# Patient Record
Sex: Male | Born: 1970 | Race: Black or African American | Hispanic: No | Smoking: Never smoker
Health system: Southern US, Community
[De-identification: ages and names within clinical notes are randomized; demographics above are authoritative.]

## PROBLEM LIST (undated history)

## (undated) DIAGNOSIS — I10 Essential (primary) hypertension: Secondary | ICD-10-CM

---

## 2008-06-13 ENCOUNTER — Emergency Department (HOSPITAL_COMMUNITY): Admission: EM | Admit: 2008-06-13 | Discharge: 2008-06-13 | Payer: Self-pay | Admitting: Emergency Medicine

## 2009-10-07 ENCOUNTER — Emergency Department (HOSPITAL_COMMUNITY): Admission: EM | Admit: 2009-10-07 | Discharge: 2009-10-08 | Payer: Self-pay | Admitting: Emergency Medicine

## 2009-12-09 ENCOUNTER — Emergency Department (HOSPITAL_COMMUNITY): Admission: EM | Admit: 2009-12-09 | Discharge: 2009-12-10 | Payer: Self-pay | Admitting: Emergency Medicine

## 2009-12-16 ENCOUNTER — Emergency Department (HOSPITAL_COMMUNITY): Admission: EM | Admit: 2009-12-16 | Discharge: 2009-12-17 | Payer: Self-pay | Admitting: Emergency Medicine

## 2009-12-17 ENCOUNTER — Ambulatory Visit: Payer: Self-pay | Admitting: Psychiatry

## 2009-12-17 ENCOUNTER — Inpatient Hospital Stay (HOSPITAL_COMMUNITY): Admission: AD | Admit: 2009-12-17 | Discharge: 2009-12-22 | Payer: Self-pay | Admitting: Psychiatry

## 2010-02-18 ENCOUNTER — Ambulatory Visit: Payer: Self-pay | Admitting: Psychiatry

## 2010-02-18 ENCOUNTER — Emergency Department (HOSPITAL_COMMUNITY)
Admission: EM | Admit: 2010-02-18 | Discharge: 2010-02-18 | Payer: Self-pay | Source: Home / Self Care | Admitting: Emergency Medicine

## 2010-10-04 ENCOUNTER — Emergency Department (HOSPITAL_COMMUNITY)
Admission: EM | Admit: 2010-10-04 | Discharge: 2010-10-04 | Payer: Self-pay | Source: Home / Self Care | Admitting: Emergency Medicine

## 2010-10-22 ENCOUNTER — Observation Stay (HOSPITAL_COMMUNITY)
Admission: RE | Admit: 2010-10-22 | Discharge: 2010-10-24 | Disposition: A | Discharge status: Home / Self Care | Payer: BC Managed Care – PPO | Source: Ambulatory Visit | Attending: Internal Medicine | Admitting: Internal Medicine

## 2010-10-22 ENCOUNTER — Emergency Department (HOSPITAL_COMMUNITY)
Admit: 2010-10-22 | Discharge: 2010-10-22 | Disposition: A | Discharge status: Home / Self Care | Payer: BC Managed Care – PPO

## 2010-10-22 DIAGNOSIS — N179 Acute kidney failure, unspecified: Secondary | ICD-10-CM | POA: Insufficient documentation

## 2010-10-22 DIAGNOSIS — E871 Hypo-osmolality and hyponatremia: Secondary | ICD-10-CM | POA: Insufficient documentation

## 2010-10-22 DIAGNOSIS — F10239 Alcohol dependence with withdrawal, unspecified: Secondary | ICD-10-CM | POA: Insufficient documentation

## 2010-10-22 DIAGNOSIS — R55 Syncope and collapse: Secondary | ICD-10-CM | POA: Insufficient documentation

## 2010-10-22 DIAGNOSIS — D649 Anemia, unspecified: Secondary | ICD-10-CM | POA: Insufficient documentation

## 2010-10-22 DIAGNOSIS — E876 Hypokalemia: Secondary | ICD-10-CM | POA: Insufficient documentation

## 2010-10-22 DIAGNOSIS — F10939 Alcohol use, unspecified with withdrawal, unspecified: Principal | ICD-10-CM | POA: Insufficient documentation

## 2010-10-22 DIAGNOSIS — I10 Essential (primary) hypertension: Secondary | ICD-10-CM | POA: Insufficient documentation

## 2010-10-22 DIAGNOSIS — D696 Thrombocytopenia, unspecified: Secondary | ICD-10-CM | POA: Insufficient documentation

## 2010-10-22 DIAGNOSIS — D72819 Decreased white blood cell count, unspecified: Secondary | ICD-10-CM | POA: Insufficient documentation

## 2010-10-22 DIAGNOSIS — R000 Tachycardia, unspecified: Secondary | ICD-10-CM | POA: Insufficient documentation

## 2010-10-22 DIAGNOSIS — F1010 Alcohol abuse, uncomplicated: Secondary | ICD-10-CM | POA: Insufficient documentation

## 2010-10-22 LAB — DIFFERENTIAL
Basophils Relative: 0 % (ref 0–1)
Eosinophils Absolute: 0 10*3/uL (ref 0.0–0.7)
Eosinophils Relative: 0 % (ref 0–5)
Lymphocytes Relative: 9 % — ABNORMAL LOW (ref 12–46)
Monocytes Absolute: 0.7 10*3/uL (ref 0.1–1.0)
Monocytes Relative: 11 % (ref 3–12)
Neutrophils Relative %: 80 % — ABNORMAL HIGH (ref 43–77)

## 2010-10-22 LAB — CK TOTAL AND CKMB (NOT AT ARMC)
CK, MB: 4.1 ng/mL — ABNORMAL HIGH (ref 0.3–4.0)
Relative Index: 0.6 (ref 0.0–2.5)
Total CK: 651 U/L — ABNORMAL HIGH (ref 7–232)

## 2010-10-22 LAB — COMPREHENSIVE METABOLIC PANEL
ALT: 75 U/L — ABNORMAL HIGH (ref 0–53)
AST: 136 U/L — ABNORMAL HIGH (ref 0–37)
Albumin: 5 g/dL (ref 3.5–5.2)
Alkaline Phosphatase: 90 U/L (ref 39–117)
BUN: 12 mg/dL (ref 6–23)
CO2: 30 mEq/L (ref 19–32)
Calcium: 9.7 mg/dL (ref 8.4–10.5)
Chloride: 82 mEq/L — ABNORMAL LOW (ref 96–112)
Creatinine, Ser: 1.52 mg/dL — ABNORMAL HIGH (ref 0.4–1.5)
GFR calc Af Amer: >60 mL/min (ref >60)
GFR calc non Af Amer: 51 mL/min — ABNORMAL LOW (ref >60)
Glucose, Bld: 133 mg/dL — ABNORMAL HIGH (ref 70–99)
Potassium: 2.7 mEq/L — CL (ref 3.5–5.1)
Sodium: 130 mEq/L — ABNORMAL LOW (ref 135–145)
Total Bilirubin: 3.2 mg/dL — ABNORMAL HIGH (ref 0.3–1.2)
Total Protein: 9.3 g/dL — ABNORMAL HIGH (ref 6.0–8.3)

## 2010-10-22 LAB — GLUCOSE, CAPILLARY: Glucose-Capillary: 242 mg/dL — ABNORMAL HIGH (ref 70–99)

## 2010-10-22 LAB — RAPID URINE DRUG SCREEN, HOSP PERFORMED
Amphetamines: NOT DETECTED
Barbiturates: NOT DETECTED
Benzodiazepines: NOT DETECTED
Cocaine: NOT DETECTED
Opiates: NOT DETECTED
Tetrahydrocannabinol: NOT DETECTED

## 2010-10-22 LAB — CBC
MCV: 83.5 fL (ref 78.0–100.0)
Platelets: 120 10*3/uL — ABNORMAL LOW (ref 150–400)

## 2010-10-22 LAB — ETHANOL: Alcohol, Ethyl (B): <5 mg/dL (ref 0–10)

## 2010-10-23 ENCOUNTER — Observation Stay (HOSPITAL_COMMUNITY)
Admit: 2010-10-23 | Discharge: 2010-10-23 | Disposition: A | Discharge status: Home / Self Care | Payer: BC Managed Care – PPO | Attending: Internal Medicine | Admitting: Internal Medicine

## 2010-10-23 DIAGNOSIS — I059 Rheumatic mitral valve disease, unspecified: Secondary | ICD-10-CM

## 2010-10-23 LAB — DIFFERENTIAL
Eosinophils Absolute: 0 10*3/uL (ref 0.0–0.7)
Eosinophils Relative: 0 % (ref 0–5)
Lymphocytes Relative: 24 % (ref 12–46)
Neutro Abs: 2.8 10*3/uL (ref 1.7–7.7)

## 2010-10-23 LAB — IRON AND TIBC
Iron: 58 ug/dL (ref 42–135)
Saturation Ratios: 16 % — ABNORMAL LOW (ref 20–55)
TIBC: 370 ug/dL (ref 215–435)
UIBC: 312 ug/dL

## 2010-10-23 LAB — BASIC METABOLIC PANEL
BUN: 12 mg/dL (ref 6–23)
CO2: 27 mEq/L (ref 19–32)
Chloride: 93 mEq/L — ABNORMAL LOW (ref 96–112)
Creatinine, Ser: 1.01 mg/dL (ref 0.4–1.5)
GFR calc Af Amer: >60 mL/min (ref >60)
Potassium: 2.3 mEq/L — CL (ref 3.5–5.1)
Sodium: 132 mEq/L — ABNORMAL LOW (ref 135–145)

## 2010-10-23 LAB — CARDIAC PANEL(CRET KIN+CKTOT+MB+TROPI): Relative Index: 0.8 (ref 0.0–2.5)

## 2010-10-23 LAB — TSH: TSH: 1.454 u[IU]/mL (ref 0.350–4.500)

## 2010-10-23 LAB — CBC
Hemoglobin: 12.9 g/dL — ABNORMAL LOW (ref 13.0–17.0)
RBC: 4.31 MIL/uL (ref 4.22–5.81)
WBC: 4.6 10*3/uL (ref 4.0–10.5)

## 2010-10-23 LAB — MAGNESIUM
Magnesium: 1.1 mg/dL — ABNORMAL LOW (ref 1.5–2.5)
Magnesium: 2.1 mg/dL (ref 1.5–2.5)

## 2010-10-23 LAB — FERRITIN: Ferritin: 873 ng/mL — ABNORMAL HIGH (ref 22–322)

## 2010-10-24 LAB — DIFFERENTIAL
Basophils Absolute: 0 10*3/uL (ref 0.0–0.1)
Lymphocytes Relative: 21 % (ref 12–46)
Lymphs Abs: 0.7 10*3/uL (ref 0.7–4.0)
Monocytes Absolute: 0.4 10*3/uL (ref 0.1–1.0)
Monocytes Relative: 12 % (ref 3–12)
Neutro Abs: 2.1 10*3/uL (ref 1.7–7.7)

## 2010-10-24 LAB — COMPREHENSIVE METABOLIC PANEL
ALT: 50 U/L (ref 0–53)
Alkaline Phosphatase: 65 U/L (ref 39–117)
BUN: 8 mg/dL (ref 6–23)
CO2: 28 mEq/L (ref 19–32)
Calcium: 8.2 mg/dL — ABNORMAL LOW (ref 8.4–10.5)
GFR calc non Af Amer: >60 mL/min (ref >60)
Glucose, Bld: 107 mg/dL — ABNORMAL HIGH (ref 70–99)
Sodium: 134 mEq/L — ABNORMAL LOW (ref 135–145)

## 2010-10-24 LAB — MAGNESIUM: Magnesium: 1.7 mg/dL (ref 1.5–2.5)

## 2010-10-24 LAB — CBC
HCT: 34.6 % — ABNORMAL LOW (ref 39.0–52.0)
Hemoglobin: 12.3 g/dL — ABNORMAL LOW (ref 13.0–17.0)
MCHC: 35.5 g/dL (ref 30.0–36.0)
MCV: 86.5 fL (ref 78.0–100.0)

## 2010-10-24 NOTE — H&P (Signed)
Adam Vaughan, MCFARREN                ACCOUNT NO.:  [id number]  MEDICAL RECORD NO.:  [id number]           PATIENT TYPE:  E  LOCATION:  APED                          FACILITY:  [facility]  PHYSICIAN:  Hollice Espy, M.D.DATE OF BIRTH:  Oct 08, 1970  DATE OF ADMISSION:  10/22/2010 DATE OF DISCHARGE:  LH                             HISTORY & PHYSICAL   ATTENDING PHYSICIAN:  Hollice Espy, MD  PATIENT'S PCP:  C. Duane Lope, MD, of [facility] in [place].  CHIEF COMPLAINT:  Syncope.  HISTORY OF PRESENT ILLNESS:  The patient is a 40 year old African male, past medical history of hypertension and alcohol abuse, who presented to the emergency room after he had a syncopal event and possible seizure. He does not recall much of the events.  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  The patient does not remember much whether or not he had warnings, but just reportedly his legs buckled underneath him and then the ground started to shake, this lasted for about a minute or so and when he woke up, he was still noted to be somewhat confused and brought into the emergency room at [facility].  Emergency room is noted to have a potassium of 2.7, creatinine of 1.5, which when compared to a creatinine in June of 2011, it was 0.88.  Urine drug screen was noted to be unremarkable as his serum alcohol level.  His CPK was elevated with a normal MB and troponin and although it was noted that his total bilirubin was 3.2.  It was suspected that the patient likely had a withdrawal seizure.  The patient reports drinking very heavily in the past about a fifth of liter and a pint and now he says he drinks about three beers a night, although he seems to be downplaying this.  When I saw the patient, he was quite tremulous and anxious.  He was still somewhat confused saying  that he thought he was at a different place rather than [facility] ER, but otherwise appears to be interactive.  He denies any headaches, vision changes, dysphagia, chest pain, palpitations, shortness of breath, wheeze, cough, abdominal pain, hematuria, dysuria, constipation, diarrhea, focal extremity numbness, weakness, or pain.  REVIEW OF SYSTEMS:  Otherwise negative.  PAST MEDICAL HISTORY:  Includes hypertension, heavy alcohol use.  He recently injured his ankle.  MEDICATIONS:  The patient is on two blood pressure medications, he cannot recall them at this time.  He was in the emergency room several weeks ago for his injured ankle and received Ativan and narcotic medication at that time since then he has run out of both.  He has no known drug allergies.  SOCIAL HISTORY:  He says he drinks a few beers a day, which I think he may be down planning this.  He denies any tobacco or drug use.  FAMILY HISTORY:  Noted for hypertension.  PHYSICAL EXAMINATION:  VITAL SIGNS:  On admission, temp 98.6, heart  rate 116 ranging up to as high as 126, blood pressure 141/110, respirations 23, O2 sat 97% on room air. GENERAL:  He is alert and oriented x2.  He appears agitated. HEENT:  Normocephalic, atraumatic.  His eyes are blood.  Mucous membranes are slightly dry. HEART:  Regular rhythm, mild tachycardia. LUNGS:  Clear to auscultation bilaterally. ABDOMEN:  Soft, nontender, nondistended.  Positive bowel sounds. EXTREMITIES:  Show no clubbing, cyanosis, or trace pitting edema.  LABORATORY WORK:  White count 6.1, H and H 16 and 42, MCV 84, platelet count 120, 80% neutrophils.  Sodium 130, potassium 2.7, chloride 82, bicarb 30, BUN 12, creatinine 1.5, glucose 133.  Please note the patient has an anion gap of 18, alcohol level less than 5.  Urine drug screen is clear.  CPK is 651, MB 4.1, troponin I 0.01.  His LFTs are noted for a total bilirubin 3.2, normal albumin, AST 136, ALT of 75.  EKG  notes some borderline LVH, but otherwise is sinus tachycardia.  CT scan of the head is unremarkable.  ASSESSMENT AND PLAN: 1. Syncope. 2. Questionable secondary seizure. 3. Severe agitation. 4. Suspected alcohol withdrawal. 5. Hypokalemia, which I think is a by-product of the above. 6. Hypertension. 7. Metabolic acidosis, which I suspect is from ethanol.  We will plan to aggressively hydrate the patient, replace potassium. Recheck labs, noted elevated bilirubin indicates chronic probably some underlying chronic liver issues from his drinking, I am seeing nothing. We will plan to admit the patient.  We will go ahead and start CIWA Ativan protocol, clonidine 0.2 p.o. b.i.d. and p.r.n. IV Lopressor.  I will place him on telemetry unit.     Hollice Espy, M.D.     SKK/MEDQ  D:  10/22/2010  T:  10/22/2010  Job:  [id number]  cc:   C. Duane Lope, M.D. Fax: [phone]  Electronically Signed by Virginia Rochester M.D. on 10/24/2010 07:31:22 PM

## 2010-10-31 NOTE — Discharge Summary (Signed)
Adam Vaughan, Adam Vaughan                ACCOUNT NO.:  [id number]  MEDICAL RECORD NO.:  [id number]           PATIENT TYPE:  I  LOCATION:  A313                          FACILITY:  [facility]  PHYSICIAN:  Elliot Cousin, M.D.    DATE OF BIRTH:  1971/04/05  DATE OF ADMISSION:  10/22/2010 DATE OF DISCHARGE:  02/04/2012LH                              DISCHARGE SUMMARY   DISCHARGE DIAGNOSES: 1. Syncope, probably secondary to alcohol withdrawal seizure and     electrolyte abnormalities. 2. Probable alcohol withdrawal seizure. 3. Alcohol abuse. 4. Hypokalemia and hypomagnesemia, likely secondary to alcohol abuse. 5. Acute renal failure secondary to prerenal azotemia.  Completely     resolved. 6. Leukopenia and thrombocytopenia, likely secondary to alcohol abuse. 7. Normocytic anemia.  The patient's hemoglobin was 12.3 prior to     discharge.  His anemia panel revealed total iron of 58, TIBC of     370, vitamin B12 of 613, folate of 10.5, and ferritin of 873. 8. Moderately elevated CK, status post fall from syncopal episode. 9. Cardiomyopathy.  The patient's ejection fraction was noted to be 40-     45% with grade 1 diastolic dysfunction. 10.Mild left elbow contusion secondary to the fall. 11.Elevated ammonia level with no evidence of hepatic     encephalopathy or asterixis. 12.Hypertension. 13.Sinus tachycardia. 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 15.Hyponatremia.  DISCHARGE MEDICATIONS: 1. Lorazepam 1 mg every 8 hours as needed for shaking with     withdrawal symptoms.  The patient was advised to not take this     medication with alcohol use. 2. Magnesium oxide 400 mg daily for 2 weeks. 3. Multivitamin with iron 1 capsule daily. 4. Potassium chloride 20 mEq daily for 2 weeks. 5. Thiamine 100 mg daily. 6. Quinapril/hydrochlorothiazide 20/12.5 mg.  The dose was decreased     to half a tablet daily. 7. Trazadone 50 mg 1-2 tablets at bedtime p.r.n.  DISCHARGE DISPOSITION:  The patient was discharged home  in improved and stable condition on October 24, 2010.  He was advised to follow up with his primary care physician Dr. Tenny Craw in 1-2 weeks.  CONSULTATIONS:  Clinical Child psychotherapist.  PROCEDURES PERFORMED: 1. X-ray of the left elbow on October 23, 2010.  The results revealed     no acute osseous abnormalities.  No elbow joint effusion. 2. Bilateral carotid duplex ultrasound on October 23, 2010.  The     results revealed minimal plaque formation in the bilateral carotid     systems.  No evidence of hemodynamically significant stenosis. 3. 2-D echocardiogram on October 23, 2010.  The results revealed left     ventricular cavity size was normal.  Wall thickness was normal.     Prominent trabeculation and papillary muscle at the apex.  Ejection     fraction was in the range of 40-45%.  Hypokinesis of the distal     anterior septal and apical myocardium.  Doppler parameters are     consistent with abnormal left ventricular relaxation (grade 1     diastolic dysfunction).  Mild mitral valve regurgitation.  Trivial     tricuspid valve regurgitation.  HISTORY OF PRESENT ILLNESS:  The patient is a 40 year old man with a past medical history significant for alcoholism and hypertension, who presented to the emergency department on October 22, 2010, after apparently passing out.  The patient was noted to have some body shaking on the ground.  There was no  head trauma.  He had recently decreased his alcohol intake significantly.  He had been drinking one  5th of liquor daily but had recently changed to beer only.  He had been drinking four 16 ounces beers daily.  That day, the patient only had 2 beers hours before he passed out.  In the emergency department, he was noted to be afebrile.  He was moderately hypertensive with a blood pressure of 141/110.  He was tachycardic with a heart rate of 116.  He was oxygenating 97%.  His lab data were significant for a normal CT scan of the head, alcohol level  of less than 5, sodium of 130, potassium of 2.7, creatinine of 1.52, CK of 651, and a negative urine drug screen.  He was admitted for further evaluation and management.  HOSPITAL COURSE: 1. SYNCOPE.  The patient was started on IV fluid hydration with D5     normal saline with potassium chloride added.  He was repleted with     potassium chloride orally as well.  The Ativan alcohol withdrawal     protocol was initiated with Ativan and vitamin therapy.  For     further evaluation, a number of studies were ordered including     cardiac enzymes, ammonia level, TSH, free T4, 2-D echocardiogram,     and carotid ultrasound.  His cardiac enzymes were such that he     ruled out for myocardial infarction.  However, his CK was     moderately elevated ranging from 651 to 571.  The relative indices     were within normal limits.  His troponin I's were within normal     limits as well.  His TSH was within normal limits at 1.45.  His     free T4 was within normal limits at 1.36.  His carotid ultrasound     revealed no significant ICA stenosis.  The 2-D echocardiogram     revealed an ejection fraction of 40-45% with hypokinesis of the     distal anterior septal and apical myocardium.  It also revealed     grade 1 diastolic dysfunction.  The patient had no evidence of     decompensated congestive heart failure.  This finding was obviously     new.  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     He may need further evaluation by a cardiologist in the outpatient     setting.  This decision will be deferred to his primary care     physician Dr. Tenny Craw.  The patient had no further syncopal episodes.     The exact etiology of his syncopal episode was unknown.  However,     it was likely that the patient had an alcohol withdrawal seizure.     An EEG was not ordered due to the lack of yield and a high     suspicion of alcohol withdrawal seizure anyway. 2. ALCOHOL ABUSE AND ALCOHOL WITHDRAWAL  SYNDROME.  The patient was     started on Ativan scheduled p.r.n.  Vitamin therapy with thiamine,     multivitamin, and folate was initiated.  For the first 24-36 hours,  the patient was tremulous and tachycardic.  His ammonia level was     elevated.  However, there was no evidence of hepatic     encephalopathy.  On exam, there was a  mild tremor initially but     no asterixis at all .  The patient had     been through inpatient and outpatient rehabilitation in the past.     He had been sober for 7 months, approximately 2 years ago.  He had     been trying to wean himself from hard liquor to beer.  Up until     hours before he was admitted to the hospital, he had been drinking     four 16 ounces beers daily.  He does have a desire to quit     altogether.  The clinical social worker was consulted.  She     provided him with additional resources including AA that would help     with his sobriety.  At the time of hospital discharge, his     tachycardia had subsided.  His tremor had resolved.  He was     ambulating in the hallway with no difficulty.  He remained alert     and oriented and very cooperative during the entire     hospitalization.  He was encouraged to continue to seek out AA     and/or other services to keep him sober.  He was also advised to     continue vitamin therapy. 3. ELECTROLYTE ABNORMALITIES AND ACUTE RENAL FAILURE.  On admission,     the patient's serum sodium was 130, serum potassium was 2.7, and     creatinine was 1.52.  He was repleted with potassium chloride     orally and in the IV fluids.  A blood magnesium level was assessed.     It was low at 1.1.  He was treated with 2 g of magnesium sulfate.     His followup blood magnesium level improved to 2.1 and then 1.7     prior to discharge.  His serum sodium improved to 134 and his serum     potassium improved to 3.4 prior to discharge.  His creatinine     improved to 0.90.  He was discharged on potassium chloride  and     magnesium oxide for 2 more weeks. 4. THROMBOCYTOPENIA, ANEMIA, AND LEUKOPENIA.  The patient's WBC was     6.1, hemoglobin 15.5, and platelet count was 120 on admission.     Following hydration, his WBC fell to 3.2, hemoglobin fell to 12.3,     and platelet count fell to 90.  In part, the decreased levels were     due to hemodilution from the IV fluids.  A major contribution was     bone marrow suppression from chronic alcohol abuse.  An anemia     panel was ordered.  The results were dictated above.  Hopefully,     his blood counts will improve off of alcohol.  His CBC may need to     be monitored periodically. 5. HYPERTENSION AND SINUS TACHYCARDIA.  The patient's systolic blood     pressure was modestly elevated although his diastolic blood     pressure was moderately elevated on admission.  He was tachycardic,     owing to volume depletion and alcohol withdrawal syndrome.     Quinapril/HCTZ was discontinued in favor of clonidine.  The     clonidine was eventually discontinued  prior to hospital discharge     as the patient's blood pressure was on the low side of normal and     his tachycardia subsided and then resolved.  Due to his low normal     blood pressures, presumably because he had not been drinking     alcohol, he was advised to restart quinapril/HCTZ at half the dose.     He was advised to follow up with his primary care physician for     reassessment in 1-2 weeks.     Elliot Cousin, M.D.     DF/MEDQ  D:  10/24/2010  T:  10/25/2010  Job:  [id number]  cc:   C. Duane Lope, M.D. Fax: [phone]  Electronically Signed by Elliot Cousin M.D. on 10/25/2010 05:12:53 PM

## 2010-12-07 LAB — BASIC METABOLIC PANEL
CO2: 28 mEq/L (ref 19–32)
CO2: 26 mEq/L (ref 19–32)
Calcium: 8.8 mg/dL (ref 8.4–10.5)
Calcium: 8.4 mg/dL (ref 8.4–10.5)
GFR calc Af Amer: >60 mL/min (ref >60)
GFR calc Af Amer: >60 mL/min (ref >60)
GFR calc non Af Amer: >60 mL/min (ref >60)
Sodium: 129 mEq/L — ABNORMAL LOW (ref 135–145)
Sodium: 145 mEq/L (ref 135–145)

## 2010-12-07 LAB — ETHANOL
Alcohol, Ethyl (B): 346 mg/dL — ABNORMAL HIGH (ref 0–10)
Alcohol, Ethyl (B): 403 mg/dL (ref 0–10)

## 2010-12-07 LAB — CBC
HCT: 38.8 % — ABNORMAL LOW (ref 39.0–52.0)
Hemoglobin: 13.3 g/dL (ref 13.0–17.0)
Hemoglobin: 13.8 g/dL (ref 13.0–17.0)
MCHC: 34.3 g/dL (ref 30.0–36.0)
MCHC: 33 g/dL (ref 30.0–36.0)
RBC: 4.79 MIL/uL (ref 4.22–5.81)
RDW: 13.6 % (ref 11.5–15.5)
WBC: 6.7 10*3/uL (ref 4.0–10.5)

## 2010-12-07 LAB — COMPREHENSIVE METABOLIC PANEL
BUN: 16 mg/dL (ref 6–23)
Calcium: 9.5 mg/dL (ref 8.4–10.5)
Glucose, Bld: 115 mg/dL — ABNORMAL HIGH (ref 70–99)
Total Protein: 8.4 g/dL — ABNORMAL HIGH (ref 6.0–8.3)

## 2010-12-07 LAB — RAPID URINE DRUG SCREEN, HOSP PERFORMED
Amphetamines: NOT DETECTED
Benzodiazepines: NOT DETECTED
Cocaine: NOT DETECTED
Opiates: NOT DETECTED
Tetrahydrocannabinol: NOT DETECTED

## 2010-12-07 LAB — URINALYSIS, ROUTINE W REFLEX MICROSCOPIC
Bilirubin Urine: NEGATIVE
Protein, ur: 30 mg/dL — AB
Urobilinogen, UA: 1 mg/dL (ref 0.0–1.0)

## 2010-12-07 LAB — DIFFERENTIAL
Basophils Relative: 1 % (ref 0–1)
Lymphocytes Relative: 39 % (ref 12–46)
Lymphs Abs: 1 10*3/uL (ref 0.7–4.0)
Lymphs Abs: 2.6 10*3/uL (ref 0.7–4.0)
Monocytes Absolute: 0.5 10*3/uL (ref 0.1–1.0)
Monocytes Relative: 16 % — ABNORMAL HIGH (ref 3–12)
Monocytes Relative: 7 % (ref 3–12)
Neutro Abs: 2.6 10*3/uL (ref 1.7–7.7)
Neutro Abs: 3.5 10*3/uL (ref 1.7–7.7)
Neutrophils Relative %: 60 % (ref 43–77)

## 2010-12-09 LAB — HEPATIC FUNCTION PANEL
Bilirubin, Direct: 0.1 mg/dL (ref 0.0–0.3)
Indirect Bilirubin: 0.4 mg/dL (ref 0.3–0.9)
Total Bilirubin: 0.5 mg/dL (ref 0.3–1.2)

## 2010-12-13 LAB — DIFFERENTIAL
Basophils Absolute: 0 10*3/uL (ref 0.0–0.1)
Basophils Relative: 1 % (ref 0–1)
Eosinophils Absolute: 0 10*3/uL (ref 0.0–0.7)
Eosinophils Absolute: 0 10*3/uL (ref 0.0–0.7)
Eosinophils Relative: 0 % (ref 0–5)
Eosinophils Relative: 0 % (ref 0–5)
Lymphocytes Relative: 15 % (ref 12–46)
Lymphocytes Relative: 24 % (ref 12–46)
Lymphs Abs: 0.6 10*3/uL — ABNORMAL LOW (ref 0.7–4.0)
Lymphs Abs: 1 10*3/uL (ref 0.7–4.0)
Monocytes Absolute: 0.4 10*3/uL (ref 0.1–1.0)
Monocytes Absolute: 0.6 10*3/uL (ref 0.1–1.0)
Monocytes Relative: 10 % (ref 3–12)
Monocytes Relative: 17 % — ABNORMAL HIGH (ref 3–12)
Neutro Abs: 2.8 10*3/uL (ref 1.7–7.7)
Neutrophils Relative %: 66 % (ref 43–77)

## 2010-12-13 LAB — CBC
HCT: 30.5 % — ABNORMAL LOW (ref 39.0–52.0)
HCT: 36.7 % — ABNORMAL LOW (ref 39.0–52.0)
Hemoglobin: 10.7 g/dL — ABNORMAL LOW (ref 13.0–17.0)
Hemoglobin: 12.7 g/dL — ABNORMAL LOW (ref 13.0–17.0)
MCHC: 34.6 g/dL (ref 30.0–36.0)
MCV: 90.3 fL (ref 78.0–100.0)
MCV: 91.6 fL (ref 78.0–100.0)
Platelets: 127 10*3/uL — ABNORMAL LOW (ref 150–400)
Platelets: 166 10*3/uL (ref 150–400)
RBC: 3.32 MIL/uL — ABNORMAL LOW (ref 4.22–5.81)
RBC: 4.06 MIL/uL — ABNORMAL LOW (ref 4.22–5.81)
RDW: 16.2 % — ABNORMAL HIGH (ref 11.5–15.5)
WBC: 3.9 10*3/uL — ABNORMAL LOW (ref 4.0–10.5)
WBC: 4.3 10*3/uL (ref 4.0–10.5)

## 2010-12-13 LAB — URINE MICROSCOPIC-ADD ON

## 2010-12-13 LAB — TRICYCLICS SCREEN, URINE: TCA Scrn: NOT DETECTED

## 2010-12-13 LAB — POCT I-STAT, CHEM 8
BUN: 11 mg/dL (ref 6–23)
BUN: 4 mg/dL — ABNORMAL LOW (ref 6–23)
Calcium, Ion: 0.95 mmol/L — ABNORMAL LOW (ref 1.12–1.32)
Calcium, Ion: 1.19 mmol/L (ref 1.12–1.32)
Chloride: 102 mEq/L (ref 96–112)
HCT: 41 % (ref 39.0–52.0)
Hemoglobin: 10.5 g/dL — ABNORMAL LOW (ref 13.0–17.0)
Potassium: 3 mEq/L — ABNORMAL LOW (ref 3.5–5.1)
Sodium: 137 mEq/L (ref 135–145)
TCO2: 26 mmol/L (ref 0–100)

## 2010-12-13 LAB — BASIC METABOLIC PANEL
BUN: 6 mg/dL (ref 6–23)
CO2: 25 mEq/L (ref 19–32)
Calcium: 9 mg/dL (ref 8.4–10.5)
Chloride: 96 mEq/L (ref 96–112)
Creatinine, Ser: 0.77 mg/dL (ref 0.4–1.5)
GFR calc Af Amer: >60 mL/min (ref >60)
GFR calc non Af Amer: >60 mL/min (ref >60)
Glucose, Bld: 103 mg/dL — ABNORMAL HIGH (ref 70–99)
Potassium: 2.8 mEq/L — ABNORMAL LOW (ref 3.5–5.1)
Sodium: 133 mEq/L — ABNORMAL LOW (ref 135–145)

## 2010-12-13 LAB — RAPID URINE DRUG SCREEN, HOSP PERFORMED
Amphetamines: NOT DETECTED
Amphetamines: NOT DETECTED
Barbiturates: NOT DETECTED
Barbiturates: NOT DETECTED
Benzodiazepines: NOT DETECTED
Benzodiazepines: NOT DETECTED
Cocaine: NOT DETECTED
Cocaine: NOT DETECTED
Opiates: NOT DETECTED
Opiates: NOT DETECTED
Tetrahydrocannabinol: NOT DETECTED
Tetrahydrocannabinol: NOT DETECTED

## 2010-12-13 LAB — POTASSIUM: Potassium: 2.9 mEq/L — ABNORMAL LOW (ref 3.5–5.1)

## 2010-12-13 LAB — PROTIME-INR
INR: 0.93 (ref 0.00–1.49)
Prothrombin Time: 12.4 seconds (ref 11.6–15.2)

## 2010-12-13 LAB — URINALYSIS, ROUTINE W REFLEX MICROSCOPIC
Nitrite: NEGATIVE
Protein, ur: >300 mg/dL — AB
Specific Gravity, Urine: 1.015 (ref 1.005–1.030)
Urobilinogen, UA: 2 mg/dL — ABNORMAL HIGH (ref 0.0–1.0)

## 2010-12-13 LAB — ETHANOL
Alcohol, Ethyl (B): 444 mg/dL (ref 0–10)
Alcohol, Ethyl (B): <5 mg/dL (ref 0–10)

## 2010-12-14 LAB — RPR: RPR Ser Ql: NONREACTIVE

## 2010-12-14 LAB — HEPATIC FUNCTION PANEL
AST: 94 U/L — ABNORMAL HIGH (ref 0–37)
Bilirubin, Direct: 0.2 mg/dL (ref 0.0–0.3)
Indirect Bilirubin: 0.6 mg/dL (ref 0.3–0.9)
Total Bilirubin: 0.8 mg/dL (ref 0.3–1.2)

## 2010-12-14 LAB — TSH: TSH: 1.019 u[IU]/mL (ref 0.350–4.500)

## 2011-05-27 IMAGING — CT CT CERVICAL SPINE W/O CM
4 of 6 series · 13 of 33 positions shown, 15 images · non-contrast
Comparison: None.

CT HEAD

CLINICAL DATA: Fall, headache, lacerations

CT HEAD WITHOUT CONTRAST
CT CERVICAL SPINE WITHOUT CONTRAST
TECHNIQUE: Multidetector CT imaging of the head and cervical spine
was performed following the standard protocol without intravenous
contrast.  Multiplanar CT image reconstructions of the cervical
spine were also generated.

[Series 5: recon 2: cervical spine · axial · 0.39mm/px · z∈[-269,-201]mm · 2 of 81 slices shown]
[im 27/81  bone]
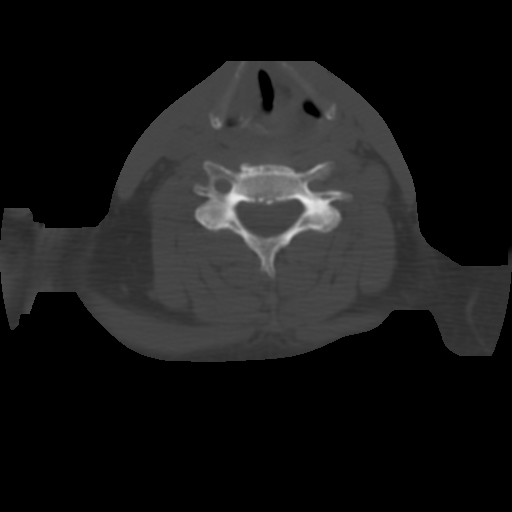
[im 54/81  bone]
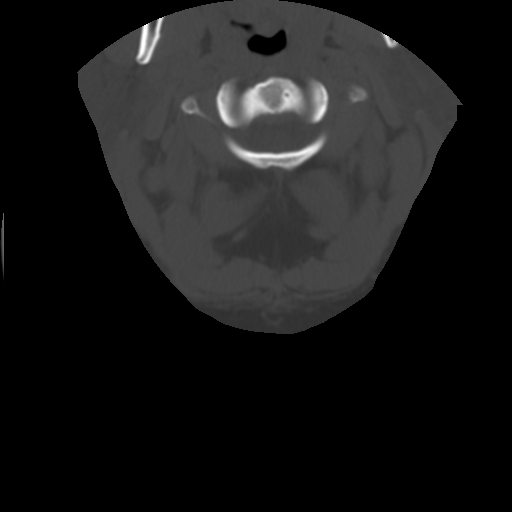

[Series 600: reformatted · coronal · 0.40mm/px · 3 of 66 slices shown (1 of 3)]
[im 17/66  bone]
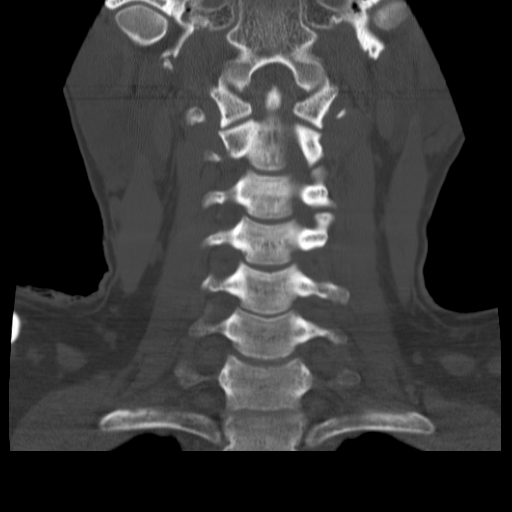
[im 28/66  bone]
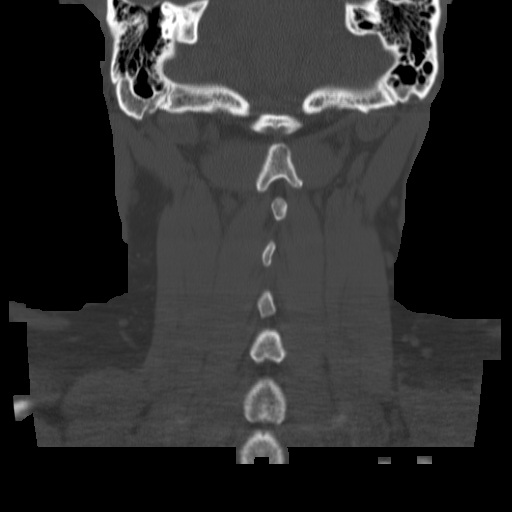
[im 38/66  bone]
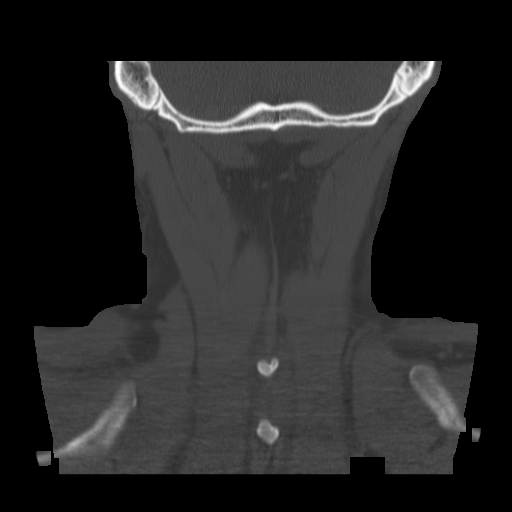

[Series 601: reformatted · sagittal · 0.40mm/px · 5 of 70 slices shown, 6 images (2 of 3)]
[im 24/70  bone]
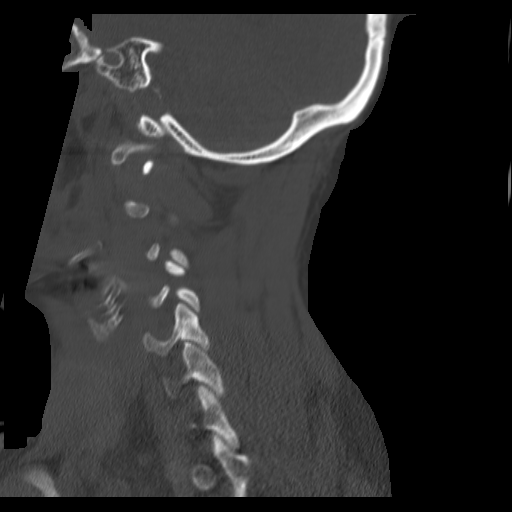
[im 29/70  bone]
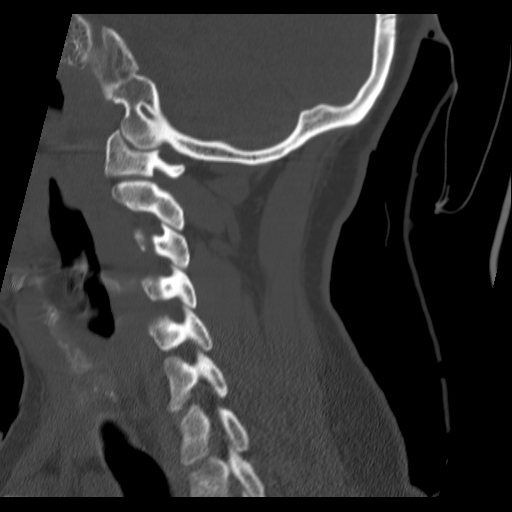
[im 35/70  soft-tissue]
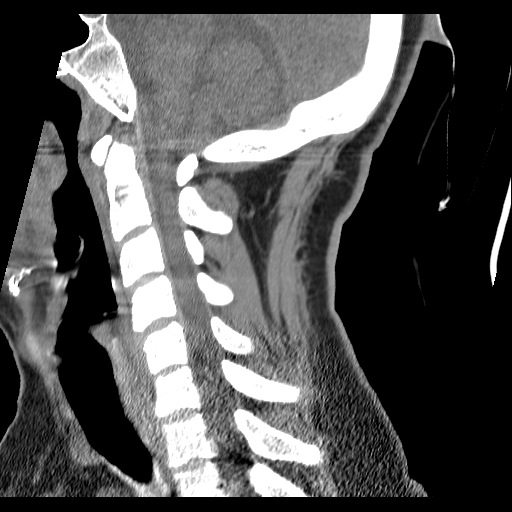
[im 35/70  bone]
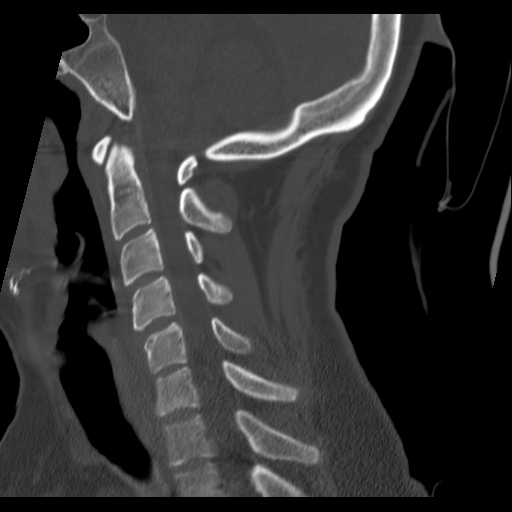
[im 41/70  bone]
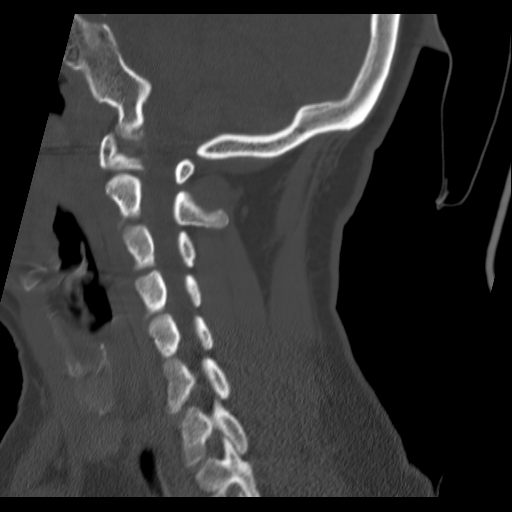
[im 47/70  bone]
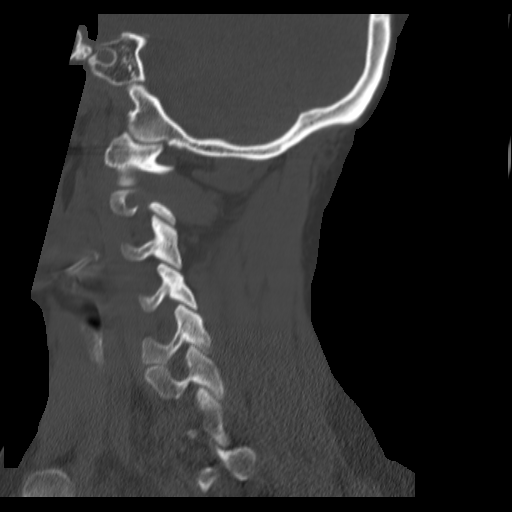

[Series 602: reformatted · axial · 0.40mm/px · z∈[-312,-233]mm · 3 of 89 slices shown, 4 images (3 of 3)]
[im 23/89  soft-tissue]
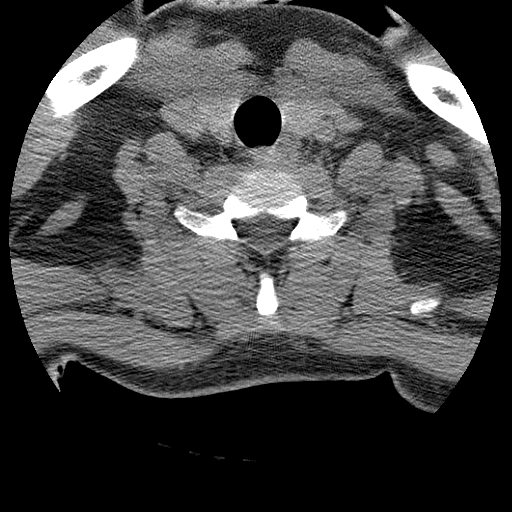
[im 23/89  bone]
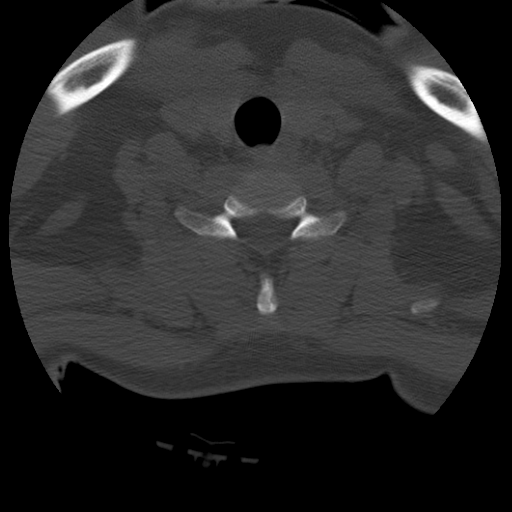
[im 45/89  bone]
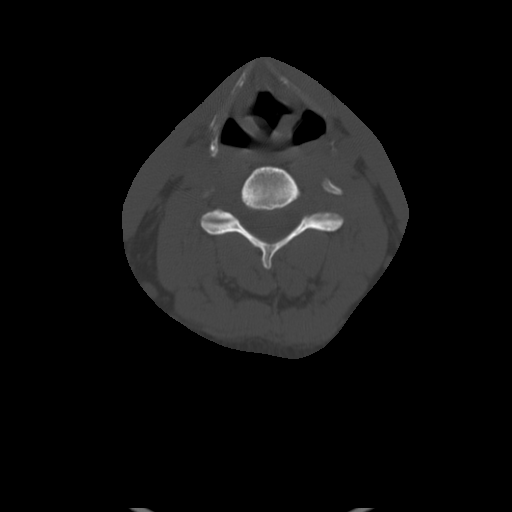
[im 67/89  bone]
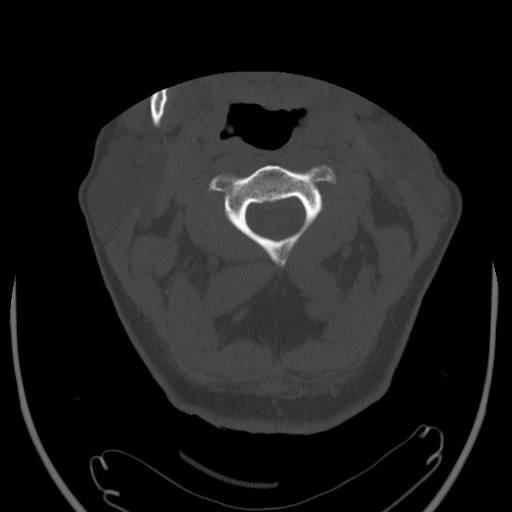

[13 of 33 positions shown; findings below may reference images not displayed]

FINDINGS: There is slight diffuse brain atrophy for the patient's
young age.  No acute intracranial hemorrhage, mass lesion,
infarction, midline shift, herniation, hydrocephalus, or extra-
axial fluid collection.  Gray-white matter differentiation
maintained.  Cisterns patent.  Soft tissue scalp injury
posteriorly.  Mastoid sinuses clear.  No depressed skull fracture.
IMPRESSION: No acute intracranial finding.

CT CERVICAL SPINE
FINDINGS: Straightened cervical spine alignment may be positional
or spasm related.  No fracture, compression deformity, or focal
kyphosis.  Facets aligned.  Foramina patent.  Preserved vertebral
body heights and disc spaces.  Very minimal disc space narrowing at
C5-6.  Intact odontoid.  No large epidural hematoma appreciated.
IMPRESSION: No acute fracture or acute finding by CT.

## 2011-06-21 LAB — POCT I-STAT, CHEM 8
Calcium, Ion: 0.92 — ABNORMAL LOW
Chloride: 100
Glucose, Bld: 118 — ABNORMAL HIGH
HCT: 46
Hemoglobin: 15.6
Potassium: 3.2 — ABNORMAL LOW

## 2012-08-08 ENCOUNTER — Encounter (HOSPITAL_BASED_OUTPATIENT_CLINIC_OR_DEPARTMENT_OTHER): Payer: Self-pay | Admitting: Emergency Medicine

## 2012-08-08 ENCOUNTER — Emergency Department (HOSPITAL_BASED_OUTPATIENT_CLINIC_OR_DEPARTMENT_OTHER): Payer: Self-pay

## 2012-08-08 ENCOUNTER — Emergency Department (HOSPITAL_BASED_OUTPATIENT_CLINIC_OR_DEPARTMENT_OTHER)
Admission: EM | Admit: 2012-08-08 | Discharge: 2012-08-08 | Disposition: A | Discharge status: Home / Self Care | Payer: Self-pay | Attending: Emergency Medicine | Admitting: Emergency Medicine

## 2012-08-08 DIAGNOSIS — E872 Acidosis: Secondary | ICD-10-CM

## 2012-08-08 DIAGNOSIS — R112 Nausea with vomiting, unspecified: Secondary | ICD-10-CM | POA: Insufficient documentation

## 2012-08-08 DIAGNOSIS — Z79899 Other long term (current) drug therapy: Secondary | ICD-10-CM | POA: Insufficient documentation

## 2012-08-08 DIAGNOSIS — E876 Hypokalemia: Secondary | ICD-10-CM | POA: Insufficient documentation

## 2012-08-08 DIAGNOSIS — I10 Essential (primary) hypertension: Secondary | ICD-10-CM | POA: Insufficient documentation

## 2012-08-08 DIAGNOSIS — E109 Type 1 diabetes mellitus without complications: Secondary | ICD-10-CM | POA: Insufficient documentation

## 2012-08-08 DIAGNOSIS — R197 Diarrhea, unspecified: Secondary | ICD-10-CM | POA: Insufficient documentation

## 2012-08-08 HISTORY — DX: Essential (primary) hypertension: I10

## 2012-08-08 LAB — BASIC METABOLIC PANEL
Chloride: 94 mEq/L — ABNORMAL LOW (ref 96–112)
GFR calc Af Amer: >90 mL/min (ref >90)
GFR calc non Af Amer: >90 mL/min (ref >90)
Potassium: 2.9 mEq/L — ABNORMAL LOW (ref 3.5–5.1)
Sodium: 133 mEq/L — ABNORMAL LOW (ref 135–145)

## 2012-08-08 LAB — CBC WITH DIFFERENTIAL/PLATELET
Basophils Absolute: 0 10*3/uL (ref 0.0–0.1)
Basophils Relative: 0 % (ref 0–1)
Lymphocytes Relative: 20 % (ref 12–46)
MCHC: 36.4 g/dL — ABNORMAL HIGH (ref 30.0–36.0)
Neutro Abs: 3.7 10*3/uL (ref 1.7–7.7)
Neutrophils Relative %: 65 % (ref 43–77)
RDW: 13.9 % (ref 11.5–15.5)
WBC: 5.7 10*3/uL (ref 4.0–10.5)

## 2012-08-08 LAB — URINALYSIS, ROUTINE W REFLEX MICROSCOPIC
Bilirubin Urine: NEGATIVE
Nitrite: NEGATIVE
Specific Gravity, Urine: 1.016 (ref 1.005–1.030)
Urobilinogen, UA: 1 mg/dL (ref 0.0–1.0)
pH: 6 (ref 5.0–8.0)

## 2012-08-08 LAB — COMPREHENSIVE METABOLIC PANEL
ALT: 56 U/L — ABNORMAL HIGH (ref 0–53)
AST: 97 U/L — ABNORMAL HIGH (ref 0–37)
Albumin: 3.7 g/dL (ref 3.5–5.2)
Alkaline Phosphatase: 107 U/L (ref 39–117)
CO2: 20 mEq/L (ref 19–32)
Chloride: 87 mEq/L — ABNORMAL LOW (ref 96–112)
Potassium: 3.1 mEq/L — ABNORMAL LOW (ref 3.5–5.1)
Sodium: 129 mEq/L — ABNORMAL LOW (ref 135–145)
Total Bilirubin: 1.6 mg/dL — ABNORMAL HIGH (ref 0.3–1.2)

## 2012-08-08 LAB — ETHANOL: Alcohol, Ethyl (B): 175 mg/dL — ABNORMAL HIGH (ref 0–11)

## 2012-08-08 MED ORDER — SODIUM CHLORIDE 0.9 % IV BOLUS (SEPSIS)
1000.0000 mL | Freq: Once | INTRAVENOUS | Status: AC
  Administered 2012-08-08: 1000 mL via INTRAVENOUS

## 2012-08-08 MED ORDER — ONDANSETRON HCL 4 MG/2ML IJ SOLN
INTRAMUSCULAR | Status: AC
  Administered 2012-08-08: 4 mg via INTRAVENOUS
  Filled 2012-08-08: qty 2

## 2012-08-08 MED ORDER — ONDANSETRON HCL 4 MG/2ML IJ SOLN
4.0000 mg | Freq: Once | INTRAMUSCULAR | Status: AC
  Administered 2012-08-08: 4 mg via INTRAVENOUS
  Filled 2012-08-08: qty 2

## 2012-08-08 MED ORDER — POTASSIUM CHLORIDE CRYS ER 20 MEQ PO TBCR: 20.0000 meq | EXTENDED_RELEASE_TABLET | Freq: Two times a day (BID) | ORAL | Status: DC

## 2012-08-08 MED ORDER — ADULT MULTIVITAMIN W/MINERALS CH
1.0000 | ORAL_TABLET | Freq: Once | ORAL | Status: AC
  Administered 2012-08-08: 1 via ORAL
  Filled 2012-08-08: qty 1

## 2012-08-08 MED ORDER — POTASSIUM CHLORIDE CRYS ER 20 MEQ PO TBCR
30.0000 meq | EXTENDED_RELEASE_TABLET | Freq: Once | ORAL | Status: AC
  Administered 2012-08-08: 30 meq via ORAL
  Filled 2012-08-08: qty 1

## 2012-08-08 MED ORDER — THIAMINE HCL 100 MG PO TABS: 100.0000 mg | ORAL_TABLET | Freq: Every day | ORAL | Status: DC

## 2012-08-08 MED ORDER — ONDANSETRON HCL 4 MG/2ML IJ SOLN
4.0000 mg | Freq: Once | INTRAMUSCULAR | Status: AC
  Administered 2012-08-08: 4 mg via INTRAVENOUS

## 2012-08-08 MED ORDER — VITAMIN B-1 100 MG PO TABS
100.0000 mg | ORAL_TABLET | Freq: Once | ORAL | Status: AC
  Administered 2012-08-08: 100 mg via ORAL
  Filled 2012-08-08: qty 1

## 2012-08-08 MED ORDER — FOLIC ACID 1 MG PO TABS
1.0000 mg | ORAL_TABLET | Freq: Once | ORAL | Status: AC
  Administered 2012-08-08: 1 mg via ORAL
  Filled 2012-08-08: qty 1

## 2012-08-08 MED ORDER — ONDANSETRON 8 MG PO TBDP: ORAL_TABLET | ORAL | Status: DC

## 2012-08-08 NOTE — ED Notes (Signed)
Pressure drsg applied to iv removal site d/t bleeding that is now controlled other wise site WNL

## 2012-08-08 NOTE — ED Notes (Signed)
Pt c/o vomiting and diarrhea x 3 days. Pt reports feeling lightheaded at work after bending over and vomiting.

## 2012-08-08 NOTE — ED Notes (Signed)
EDP into see pt after episode of nausea with small emesis order for Zofran obtained and given

## 2012-08-08 NOTE — ED Notes (Signed)
Tolerated po challenge well og ginger ale x1 container and 4 packs crackers

## 2012-08-08 NOTE — ED Provider Notes (Signed)
History     CSN: [id number]  Arrival date & time 08/08/12  0220   First MD Initiated Contact with Patient 08/08/12 0232      Chief Complaint  Patient presents with  . Emesis  . Diarrhea    (Consider location/radiation/quality/duration/timing/severity/associated sxs/prior treatment) Patient is a 41 y.o. male presenting with vomiting and diarrhea. The history is provided by the patient.  Emesis  This is a new problem. The current episode started more than 2 days ago. The problem occurs 2 to 4 times per day. The problem has not changed since onset.The emesis has an appearance of stomach contents. There has been no fever. Associated symptoms include diarrhea. Pertinent negatives include no abdominal pain, no chills, no cough, no fever and no sweats. Risk factors include ill contacts (people at work with same).  Diarrhea The primary symptoms include nausea, vomiting and diarrhea. Primary symptoms do not include fever, abdominal pain, hematochezia or rash. The illness began 3 to 5 days ago. The onset was sudden. The problem has not changed since onset. Nausea began 3 to 5 days ago. The nausea is associated with eating. The nausea is exacerbated by food.  The vomiting began more than 2 days ago.  The diarrhea began 3 to 5 days ago. The diarrhea is watery. The diarrhea occurs 2 to 4 times per day. 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  The illness does not include chills, bloating or tenesmus. Associated medical issues do not include inflammatory bowel disease. Risk factors for a gastrointestinal illness include alcohol use.    Past Medical History  Diagnosis Date  . Hypertension     History reviewed. No pertinent past surgical history.  No family history on file.  History  Substance Use Topics  . Smoking status: Never Smoker   . Smokeless tobacco: Not on file  . Alcohol Use: Yes      Review of Systems  Constitutional: Negative for fever and chills.  Respiratory:  Negative for cough.   Gastrointestinal: Positive for nausea, vomiting and diarrhea. Negative for abdominal pain, hematochezia and bloating.  Genitourinary: Negative for difficulty urinating.  Skin: Negative for rash.  All other systems reviewed and are negative.    Allergies  Review of patient's allergies indicates no known allergies.  Home Medications   Current Outpatient Rx  Name  Route  Sig  Dispense  Refill  . BENAZEPRIL-HYDROCHLOROTHIAZIDE 20-25 MG PO TABS   Oral   Take 1 tablet by mouth daily.         . TRAZODONE HCL 100 MG PO TABS   Oral   Take 50 mg by mouth at bedtime.           BP 156/95  Pulse 115  Temp 98.9 F (37.2 C) (Oral)  Resp 18  Ht 5\' 10"  (1.778 m)  Wt 190 lb (86.183 kg)  BMI 27.26 kg/m2  SpO2 99%  Physical Exam  Constitutional: He is oriented to person, place, and time. He appears well-developed and well-nourished. No distress.       Smells of alcohol  HENT:  Head: Normocephalic and atraumatic.  Mouth/Throat: Oropharynx is clear and moist.  Eyes: Conjunctivae normal are normal. Pupils are equal, round, and reactive to light.  Neck: Normal range of motion. Neck supple.  Cardiovascular: Normal rate and regular rhythm.   Pulmonary/Chest: Effort normal and breath sounds normal. He has no wheezes. He has no rales.  Abdominal: Soft. Bowel sounds are normal. He exhibits no mass. There is  no tenderness. There is no rebound and no guarding.  Musculoskeletal: Normal range of motion. He exhibits no edema.  Neurological: He is alert and oriented to person, place, and time. He has normal reflexes.  Skin: Skin is warm and dry.  Psychiatric: He has a normal mood and affect.    ED Course  Procedures (including critical care time)  Labs Reviewed  CBC WITH DIFFERENTIAL - Abnormal; Notable for the following:    Hemoglobin 12.8 (*)     HCT 35.2 (*)     MCHC 36.4 (*)     Monocytes Relative 14 (*)     All other components within normal limits    COMPREHENSIVE METABOLIC PANEL  LIPASE, BLOOD  ETHANOL   No results found.   No diagnosis found.    MDM  Patient states he drinks one beer a day, I suspect it is far more than that.  States he is not keeping any food or liquids down but mucus membranes are moist and ETOH level is 175 so he has kept down some liquids and suspect that the vomiting is actually an alcoholic gastritis and alcoholic ketoacidosis.  Will hydrate aggressively with NSS. Thiamine folate and MVI given PO.  AST and ALT and bili are consistent with previous levels for 10/2010.   Reched BMP post 2 liters NSS, improved gap.  Patient informed of need to quit all alcohol totally and need to have labs rechecked later this week and return for worsening pain, fevers intractable vomiting.  Rigid abdomen or any concerns.  Patient verbalizes understanding and agrees to follow up No white count no fever no cough, do not think this is an pna on xr.    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 This excludes time spent performing separately reportable procedures and services.    CRITICAL CARE Performed by: Jasmine Awe   Total critical care time: 60 minutes  Critical care time was exclusive of separately billable procedures and treating other patients.  Critical care was necessary to treat or prevent imminent or life-threatening deterioration.  Critical care was time spent personally by me on the following activities: development of treatment plan with patient and/or surrogate as well as nursing, discussions with consultants, evaluation of patient's response to treatment, examination of patient, obtaining history from patient or surrogate, ordering and performing treatments and interventions, ordering and review of laboratory studies, ordering and review of  radiographic studies, pulse oximetry and re-evaluation of patient's condition.        Jasmine Awe, MD 08/08/12 [phone]

## 2012-08-08 NOTE — ED Notes (Signed)
Pt continues to deny nausea and no further emesis noted tolerating po intake well po MVI given. EDP aware

## 2012-08-08 NOTE — ED Notes (Addendum)
After episode of nausea lasting approx 4-5 minutes pt states nausea resolved and second po challenge attempted and pt currently is tolerating it well and is w/o N/V. Pt Encouraged to follow up with his PMD at [facility] physicians and  consumption of ETOH beverages was discouraged until after meeting with him. Pt verbalized understanding of instructions.

## 2012-09-14 ENCOUNTER — Emergency Department (HOSPITAL_COMMUNITY)
Admission: EM | Admit: 2012-09-14 | Discharge: 2012-09-15 | Disposition: A | Discharge status: Home / Self Care | Payer: Self-pay | Attending: Emergency Medicine | Admitting: Emergency Medicine

## 2012-09-14 ENCOUNTER — Encounter (HOSPITAL_COMMUNITY): Payer: Self-pay | Admitting: *Deleted

## 2012-09-14 DIAGNOSIS — I10 Essential (primary) hypertension: Secondary | ICD-10-CM | POA: Insufficient documentation

## 2012-09-14 DIAGNOSIS — F1010 Alcohol abuse, uncomplicated: Secondary | ICD-10-CM | POA: Insufficient documentation

## 2012-09-14 DIAGNOSIS — Z79899 Other long term (current) drug therapy: Secondary | ICD-10-CM | POA: Insufficient documentation

## 2012-09-14 DIAGNOSIS — F17200 Nicotine dependence, unspecified, uncomplicated: Secondary | ICD-10-CM | POA: Insufficient documentation

## 2012-09-14 LAB — COMPREHENSIVE METABOLIC PANEL
Albumin: 4.2 g/dL (ref 3.5–5.2)
Alkaline Phosphatase: 98 U/L (ref 39–117)
BUN: 6 mg/dL (ref 6–23)
CO2: 24 mEq/L (ref 19–32)
Chloride: 91 mEq/L — ABNORMAL LOW (ref 96–112)
GFR calc non Af Amer: >90 mL/min (ref >90)
Glucose, Bld: 109 mg/dL — ABNORMAL HIGH (ref 70–99)
Potassium: 3.4 mEq/L — ABNORMAL LOW (ref 3.5–5.1)
Total Bilirubin: 1.1 mg/dL (ref 0.3–1.2)

## 2012-09-14 LAB — URINALYSIS, ROUTINE W REFLEX MICROSCOPIC
Glucose, UA: NEGATIVE mg/dL
Ketones, ur: 15 mg/dL — AB
Leukocytes, UA: NEGATIVE
Protein, ur: 100 mg/dL — AB
Urobilinogen, UA: 1 mg/dL (ref 0.0–1.0)

## 2012-09-14 LAB — CBC WITH DIFFERENTIAL/PLATELET
Basophils Relative: 0 % (ref 0–1)
HCT: 42.8 % (ref 39.0–52.0)
Hemoglobin: 15.2 g/dL (ref 13.0–17.0)
Lymphocytes Relative: 24 % (ref 12–46)
Lymphs Abs: 1.2 10*3/uL (ref 0.7–4.0)
Monocytes Absolute: 0.4 10*3/uL (ref 0.1–1.0)
Monocytes Relative: 7 % (ref 3–12)
Neutro Abs: 3.4 10*3/uL (ref 1.7–7.7)
Neutrophils Relative %: 69 % (ref 43–77)
RBC: 5.2 MIL/uL (ref 4.22–5.81)
WBC: 5 10*3/uL (ref 4.0–10.5)

## 2012-09-14 LAB — RAPID URINE DRUG SCREEN, HOSP PERFORMED
Barbiturates: NOT DETECTED
Benzodiazepines: NOT DETECTED
Cocaine: NOT DETECTED
Tetrahydrocannabinol: NOT DETECTED

## 2012-09-14 LAB — URINE MICROSCOPIC-ADD ON

## 2012-09-14 MED ORDER — ONDANSETRON HCL 4 MG PO TABS: 4.0000 mg | ORAL_TABLET | Freq: Three times a day (TID) | ORAL | Status: DC | PRN

## 2012-09-14 MED ORDER — LORAZEPAM 1 MG PO TABS
1.0000 mg | ORAL_TABLET | Freq: Four times a day (QID) | ORAL | Status: DC | PRN
  Administered 2012-09-14: 1 mg via ORAL
  Filled 2012-09-14: qty 1

## 2012-09-14 MED ORDER — THIAMINE HCL 100 MG/ML IJ SOLN: 100.0000 mg | Freq: Every day | INTRAMUSCULAR | Status: DC

## 2012-09-14 MED ORDER — ADULT MULTIVITAMIN W/MINERALS CH
1.0000 | ORAL_TABLET | Freq: Every day | ORAL | Status: DC
  Administered 2012-09-14 – 2012-09-15 (×2): 1 via ORAL
  Filled 2012-09-14 (×2): qty 1

## 2012-09-14 MED ORDER — FOLIC ACID 1 MG PO TABS
1.0000 mg | ORAL_TABLET | Freq: Every day | ORAL | Status: DC
  Administered 2012-09-14 – 2012-09-15 (×2): 1 mg via ORAL
  Filled 2012-09-14 (×2): qty 1

## 2012-09-14 MED ORDER — SODIUM CHLORIDE 0.9 % IV BOLUS (SEPSIS)
2000.0000 mL | Freq: Once | INTRAVENOUS | Status: AC
  Administered 2012-09-14: 2000 mL via INTRAVENOUS

## 2012-09-14 MED ORDER — ONDANSETRON HCL 4 MG/2ML IJ SOLN
4.0000 mg | Freq: Once | INTRAMUSCULAR | Status: AC
  Administered 2012-09-14: 4 mg via INTRAVENOUS
  Filled 2012-09-14: qty 2

## 2012-09-14 MED ORDER — LORAZEPAM 2 MG/ML IJ SOLN: 1.0000 mg | Freq: Four times a day (QID) | INTRAMUSCULAR | Status: DC | PRN

## 2012-09-14 MED ORDER — ACETAMINOPHEN 325 MG PO TABS: 650.0000 mg | ORAL_TABLET | ORAL | Status: DC | PRN

## 2012-09-14 MED ORDER — NICOTINE 21 MG/24HR TD PT24
21.0000 mg | MEDICATED_PATCH | Freq: Every day | TRANSDERMAL | Status: DC
  Administered 2012-09-15: 21 mg via TRANSDERMAL

## 2012-09-14 MED ORDER — VITAMIN B-1 100 MG PO TABS
100.0000 mg | ORAL_TABLET | Freq: Every day | ORAL | Status: DC
  Administered 2012-09-14 – 2012-09-15 (×2): 100 mg via ORAL
  Filled 2012-09-14 (×2): qty 1

## 2012-09-14 MED ORDER — LORAZEPAM 2 MG/ML IJ SOLN
1.0000 mg | Freq: Once | INTRAMUSCULAR | Status: AC
  Administered 2012-09-14: 1 mg via INTRAVENOUS
  Filled 2012-09-14: qty 1

## 2012-09-14 MED ORDER — IBUPROFEN 600 MG PO TABS: 600.0000 mg | ORAL_TABLET | Freq: Three times a day (TID) | ORAL | Status: DC | PRN

## 2012-09-14 MED ORDER — SODIUM CHLORIDE 0.9 % IV SOLN
Freq: Once | INTRAVENOUS | Status: AC
  Administered 2012-09-14: 20 mL/h via INTRAVENOUS

## 2012-09-14 MED ORDER — ZOLPIDEM TARTRATE 5 MG PO TABS
5.0000 mg | ORAL_TABLET | Freq: Every evening | ORAL | Status: DC | PRN
  Administered 2012-09-14: 5 mg via ORAL
  Filled 2012-09-14: qty 1

## 2012-09-14 NOTE — ED Provider Notes (Addendum)
History     CSN: [id number]  Arrival date & time 09/14/12  1426   First MD Initiated Contact with Patient 09/14/12 1543      Chief Complaint  Patient presents with  . Emesis    (Consider location/radiation/quality/duration/timing/severity/associated sxs/prior treatment) Patient is a 41 y.o. male presenting with vomiting. The history is provided by the patient.  Emesis  This is a recurrent (states that he has been on an alcohol binge for the last 4 days due to being depressed around the holidays and for the last 2 days has been vomiting.) problem. The current episode started 2 days ago. The problem occurs 5 to 10 times per day. The problem has been gradually worsening. The emesis has an appearance of stomach contents. There has been no fever. Pertinent negatives include no abdominal pain, no chills, no cough, no diarrhea, no fever, no headaches and no URI.    Past Medical History  Diagnosis Date  . Hypertension     History reviewed. No pertinent past surgical history.  History reviewed. No pertinent family history.  History  Substance Use Topics  . Smoking status: Never Smoker   . Smokeless tobacco: Current User    Types: Snuff  . Alcohol Use: Yes     Comment: hx of alcoholism, 1/5th liquor/day      Review of Systems  Constitutional: Negative for fever and chills.  Respiratory: Negative for cough.   Gastrointestinal: Positive for vomiting. Negative for abdominal pain and diarrhea.  Neurological: Negative for headaches.  All other systems reviewed and are negative.    Allergies  Review of patient's allergies indicates no known allergies.  Home Medications   Current Outpatient Rx  Name  Route  Sig  Dispense  Refill  . MELATIN PO   Oral   Take 1 tablet by mouth at bedtime.           BP 136/93  Pulse 140  Temp 99.2 F (37.3 C) (Oral)  Resp 16  SpO2 98%  Physical Exam  Nursing note and vitals reviewed. Constitutional: He is oriented to person,  place, and time. He appears well-developed and well-nourished. No distress.  HENT:  Head: Normocephalic and atraumatic.  Mouth/Throat: Oropharynx is clear and moist. Mucous membranes are dry.  Eyes: Conjunctivae normal and EOM are normal. Pupils are equal, round, and reactive to light.  Neck: Normal range of motion. Neck supple.  Cardiovascular: Regular rhythm and intact distal pulses.  Tachycardia present.   No murmur heard. Pulmonary/Chest: Effort normal and breath sounds normal. No respiratory distress. He has no wheezes. He has no rales.  Abdominal: Soft. Normal appearance. He exhibits no distension. There is tenderness in the epigastric area. There is no rebound and no guarding.       Mild epigastric tenderness  Musculoskeletal: Normal range of motion. He exhibits no edema and no tenderness.  Neurological: He is alert and oriented to person, place, and time.  Skin: Skin is warm and dry. No rash noted. No erythema.  Psychiatric: His behavior is normal. Judgment normal. He exhibits a depressed mood. He expresses no homicidal and no suicidal ideation.    ED Course  Procedures (including critical care time)  Labs Reviewed  COMPREHENSIVE METABOLIC PANEL - Abnormal; Notable for the following:    Potassium 3.4 (*)     Chloride 91 (*)     Glucose, Bld 109 (*)     AST 151 (*)     ALT 80 (*)     All other  components within normal limits  URINALYSIS, ROUTINE W REFLEX MICROSCOPIC - Abnormal; Notable for the following:    APPearance CLOUDY (*)     Hgb urine dipstick TRACE (*)     Ketones, ur 15 (*)     Protein, ur 100 (*)     All other components within normal limits  ETHANOL - Abnormal; Notable for the following:    Alcohol, Ethyl (B) 114 (*)     All other components within normal limits  URINE MICROSCOPIC-ADD ON - Abnormal; Notable for the following:    Casts HYALINE CASTS (*)     All other components within normal limits  CBC WITH DIFFERENTIAL  LIPASE, BLOOD  URINE RAPID DRUG  SCREEN (HOSP PERFORMED)   No results found.   No diagnosis found.    MDM   Patient with a history of severe alcohol abuse who has been on a binge for the last 4 days and states that he has been unable to eat for the last 2 days and has been vomiting. He he states that he is depressed but not currently suicidal but states that he feels that he needs help. He currently only takes trazodone for sleep but is not getting treatment for depression. He also states his alcohol abuse has been long-standing and when he does not have alcohol he withdrawals. His last drink of alcohol was this morning around 6 AM. He denies any abdominal pain or diarrhea. No infectious symptoms. On exam he is to hydrated and tachycardic with normal blood pressure. Will hydrate patient and checked for pancreatitis or other lab abnormalities. CBC, CMP, UA, lipase, EtOH, UDS pending. Will have ACT come and evaluate the patient once he is medically clear   8:15 PM Labs are unremarkable and patient is now tolerating by mouth and was back to the psych ED to  11:46 PM Pt cleared by telepsych and pt wants detox.  No med recommendations.   Gwyneth Sprout, MD 09/14/12 2015  Gwyneth Sprout, MD 09/14/12 [phone]

## 2012-09-14 NOTE — ED Notes (Signed)
telepsych request faxed and called

## 2012-09-14 NOTE — ED Notes (Signed)
One bag in locker in 40

## 2012-09-14 NOTE — ED Notes (Signed)
Pt placed in paper scrubs, wanded by security; pt and belongings transferred to the Pysch ED

## 2012-09-14 NOTE — ED Notes (Signed)
telepsych being conducted.  

## 2012-09-14 NOTE — ED Notes (Signed)
Pt given ice chips for PO challenge °

## 2012-09-14 NOTE — ED Notes (Signed)
Pt c/o dizziness with movement   

## 2012-09-14 NOTE — ED Notes (Addendum)
Pt reports hx of alcoholism. Had been sober for 6 months, started drinking again 1 month ago. Pt reports he started binge drinking x1 week, 1/5 of liquor/ day, last ETOH 0600. Pt reports he has been vomiting 4-5 days, states he can't keep any fluids or food now. Drinks ETOH and vomits as well. Denies drug use. Has been to detox in past at [facility]. Reports some depression, but denies SI/ HI, auditory or visual hallucinations.    Abdominal cramping, pain 6/10, when vomiting pt becomes SOB and dizzy.   Pt hx of HTN, has not taken medications in 3 weeks. Insurance does not start till [place].

## 2012-09-15 NOTE — BH Assessment (Signed)
Assessment Note   Adam Vaughan is a 40 y.o. male who presents to [facility] for detox from alcohol.  Pt denies SI/HI/Psych.  Pt reports drinking 1/5 of Vodka daily and also drinks 6-12 beers wkly.  Pt.'s last use was 09/14/12.  Pt has previous inpt detox hx with [facility], [facility], [facility] and [facility].  Pt denies issues with seizures, however says he blacked out 2 yrs ago.  Pt has no legal problems.  Pt c/o w/d sxs: slight tremors.    Axis I: Alcohol Dependence Axis II: Deferred Axis III:  Past Medical History  Diagnosis Date  . Hypertension    Axis IV: other psychosocial or environmental problems and problems related to social environment Axis V: 51-60 moderate symptoms  Past Medical History:  Past Medical History  Diagnosis Date  . Hypertension     History reviewed. No pertinent past surgical history.  Family History: History reviewed. No pertinent family history.  Social History:  reports that he has never smoked. His smokeless tobacco use includes Snuff. He reports that he drinks alcohol. He reports that he does not use illicit drugs.  Additional Social History:  Alcohol / Drug Use Pain Medications: None  Prescriptions: None  Over the Counter: None  History of alcohol / drug use?: Yes Longest period of sobriety (when/how long): Unk  Substance #1 Name of Substance 1: Alcohol---Vodka, Beer   1 - Age of First Use: Teens  1 - Amount (size/oz): 1/5 and 6-12 Beers(wkly)  1 - Frequency: Daily  1 - Duration: On-going  1 - Last Use / Amount: 09/14/12  CIWA: CIWA-Ar BP: 133/84 mmHg Pulse Rate: 106  Nausea and Vomiting: no nausea and no vomiting Tactile Disturbances: none Tremor: two Auditory Disturbances: not present Paroxysmal Sweats: no sweat visible Visual Disturbances: not present Anxiety: no anxiety, at ease Headache, Fullness in Head: none present Agitation: normal activity Orientation and Clouding of Sensorium: oriented and can do serial additions CIWA-Ar Total: 2  COWS:     Allergies: No Known Allergies  Home Medications:  (Not in a hospital admission)  OB/GYN Status:  No LMP for male patient.  General Assessment Data Location of Assessment: [facility] Living Arrangements: Spouse/significant other Can pt return to current living arrangement?: Yes Admission Status: Voluntary Is patient capable of signing voluntary admission?: Yes Transfer from: Acute Hospital Referral Source: MD  Education Status Is patient currently in school?: No Current Grade: None  Highest grade of school patient has completed: None  Name of school: none  Contact person: none   Risk to self Suicidal Ideation: No Suicidal Intent: No Is patient at risk for suicide?: No Suicidal Plan?: No Access to Means: No What has been your use of drugs/alcohol within the last 12 months?: Abusing: Alcohol  Previous Attempts/Gestures: No How many times?: 0  Other Self Harm Risks: None  Triggers for Past Attempts: None known Intentional Self Injurious Behavior: None Family Suicide History: No Recent stressful life event(s): Other (Comment) (Chronic SA ) Persecutory voices/beliefs?: No Depression: Yes Depression Symptoms: Loss of interest in usual pleasures Substance abuse history and/or treatment for substance abuse?: Yes Suicide prevention information given to non-admitted patients: Not applicable  Risk to Others Homicidal Ideation: No Thoughts of Harm to Others: No Current Homicidal Intent: No Current Homicidal Plan: No Access to Homicidal Means: No Identified Victim: None  History of harm to others?: No Assessment of Violence: None Noted Violent Behavior Description: None  Does patient have access to weapons?: No Criminal Charges Pending?: No Does patient  have a court date: No  Psychosis Hallucinations: None noted Delusions: None noted  Mental Status Report Appear/Hygiene: Other (Comment) (Appropriate ) Motor Activity: Tremors Speech: Logical/coherent Level of  Consciousness: Alert Mood: Depressed Affect: Depressed Anxiety Level: None Thought Processes: Coherent;Relevant Judgement: Unimpaired Orientation: Person;Place;Time;Situation Obsessive Compulsive Thoughts/Behaviors: None  Cognitive Functioning Concentration: Normal Memory: Recent Intact;Remote Intact IQ: Average Insight: Fair Impulse Control: Fair Appetite: Good Weight Loss: 0  Weight Gain: 0  Sleep: No Change Total Hours of Sleep: 6  Vegetative Symptoms: None  ADLScreening [facility] Assessment Services) Patient's cognitive ability adequate to safely complete daily activities?: Yes Patient able to express need for assistance with ADLs?: Yes Independently performs ADLs?: Yes (appropriate for developmental age)  Abuse/Neglect [facility]) Physical Abuse: Denies Verbal Abuse: Denies Sexual Abuse: Denies  Prior Inpatient Therapy Prior Inpatient Therapy: Yes Prior Therapy Dates: 2013 Prior Therapy Facilty/Provider(s): [facility], [facility], [facility], [facility]  Reason for Treatment: Detox/Rehab   Prior Outpatient Therapy Prior Outpatient Therapy: No Prior Therapy Dates: None  Prior Therapy Facilty/Provider(s): None  Reason for Treatment: None   ADL Screening (condition at time of admission) Patient's cognitive ability adequate to safely complete daily activities?: Yes Patient able to express need for assistance with ADLs?: Yes Independently performs ADLs?: Yes (appropriate for developmental age) Weakness of Legs: None Weakness of Arms/Hands: None  Home Assistive Devices/Equipment Home Assistive Devices/Equipment: None  Therapy Consults (therapy consults require a physician order) PT Evaluation Needed: No OT Evalulation Needed: No SLP Evaluation Needed: No Abuse/Neglect Assessment (Assessment to be complete while patient is alone) Physical Abuse: Denies Verbal Abuse: Denies Sexual Abuse: Denies Exploitation of patient/patient's resources: Denies Self-Neglect: Denies Values /  Beliefs Cultural Requests During Hospitalization: None Spiritual Requests During Hospitalization: None Consults Spiritual Care Consult Needed: No Social Work Consult Needed: No Merchant navy officer (For Healthcare) Advance Directive: Patient does not have advance directive;Patient would not like information Pre-existing out of facility DNR order (yellow form or pink MOST form): No Nutrition Screen- [facility] Patient's home diet: Regular Have you recently lost weight without trying?: No Have you been eating poorly because of a decreased appetite?: No Malnutrition Screening Tool Score: 0   Additional Information 1:1 In Past 12 Months?: No CIRT Risk: No Elopement Risk: No Does patient have medical clearance?: Yes     Disposition:  Disposition Disposition of Patient: Inpatient treatment program;Referred to ([facility]) Type of inpatient treatment program: Adult Patient referred to: [facility]  On Site Evaluation by:   Reviewed with Physician:     Murrell Redden 09/15/2012 5:10 AM

## 2012-09-15 NOTE — ED Notes (Signed)
Pt discharged to [facility] with volunteer. Denies SI/HI. Instructions reviewed. Pt verbalized understanding.

## 2014-12-08 ENCOUNTER — Encounter (HOSPITAL_COMMUNITY): Payer: Self-pay

## 2014-12-08 ENCOUNTER — Emergency Department (HOSPITAL_COMMUNITY): Payer: BLUE CROSS/BLUE SHIELD

## 2014-12-08 ENCOUNTER — Emergency Department (HOSPITAL_COMMUNITY)
Admission: EM | Admit: 2014-12-08 | Discharge: 2014-12-08 | Disposition: A | Discharge status: Home / Self Care | Payer: BLUE CROSS/BLUE SHIELD | Attending: Emergency Medicine | Admitting: Emergency Medicine

## 2014-12-08 DIAGNOSIS — Z79899 Other long term (current) drug therapy: Secondary | ICD-10-CM | POA: Diagnosis not present

## 2014-12-08 DIAGNOSIS — I10 Essential (primary) hypertension: Secondary | ICD-10-CM | POA: Insufficient documentation

## 2014-12-08 DIAGNOSIS — K7010 Alcoholic hepatitis without ascites: Secondary | ICD-10-CM | POA: Diagnosis not present

## 2014-12-08 DIAGNOSIS — R112 Nausea with vomiting, unspecified: Secondary | ICD-10-CM | POA: Diagnosis present

## 2014-12-08 LAB — CBC WITH DIFFERENTIAL/PLATELET
BASOS ABS: 0 10*3/uL (ref 0.0–0.1)
BASOS PCT: 1 % (ref 0–1)
EOS PCT: 0 % (ref 0–5)
Eosinophils Absolute: 0 10*3/uL (ref 0.0–0.7)
HCT: 41.3 % (ref 39.0–52.0)
HEMOGLOBIN: 14.3 g/dL (ref 13.0–17.0)
LYMPHS ABS: 0.9 10*3/uL (ref 0.7–4.0)
Lymphocytes Relative: 24 % (ref 12–46)
MCH: 29 pg (ref 26.0–34.0)
MCHC: 34.6 g/dL (ref 30.0–36.0)
MCV: 83.8 fL (ref 78.0–100.0)
Monocytes Absolute: 0.3 10*3/uL (ref 0.1–1.0)
Monocytes Relative: 8 % (ref 3–12)
NEUTROS ABS: 2.7 10*3/uL (ref 1.7–7.7)
Neutrophils Relative %: 67 % (ref 43–77)
PLATELETS: 241 10*3/uL (ref 150–400)
RBC: 4.93 MIL/uL (ref 4.22–5.81)
RDW: 14.9 % (ref 11.5–15.5)
WBC: 3.9 10*3/uL — AB (ref 4.0–10.5)

## 2014-12-08 LAB — URINALYSIS, ROUTINE W REFLEX MICROSCOPIC
GLUCOSE, UA: NEGATIVE mg/dL
HGB URINE DIPSTICK: NEGATIVE
Ketones, ur: NEGATIVE mg/dL
LEUKOCYTES UA: NEGATIVE
NITRITE: NEGATIVE
PROTEIN: NEGATIVE mg/dL
SPECIFIC GRAVITY, URINE: 1.02 (ref 1.005–1.030)
UROBILINOGEN UA: 4 mg/dL — AB (ref 0.0–1.0)
pH: 7 (ref 5.0–8.0)

## 2014-12-08 LAB — COMPREHENSIVE METABOLIC PANEL
ALK PHOS: 179 U/L — AB (ref 39–117)
ALT: 338 U/L — AB (ref 0–53)
ANION GAP: 15 (ref 5–15)
AST: 733 U/L — AB (ref 0–37)
Albumin: 3.6 g/dL (ref 3.5–5.2)
BUN: 13 mg/dL (ref 6–23)
CHLORIDE: 95 mmol/L — AB (ref 96–112)
CO2: 24 mmol/L (ref 19–32)
Calcium: 7.9 mg/dL — ABNORMAL LOW (ref 8.4–10.5)
Creatinine, Ser: 0.75 mg/dL (ref 0.50–1.35)
GFR calc non Af Amer: >90 mL/min (ref >90)
GLUCOSE: 116 mg/dL — AB (ref 70–99)
POTASSIUM: 3.1 mmol/L — AB (ref 3.5–5.1)
SODIUM: 134 mmol/L — AB (ref 135–145)
TOTAL PROTEIN: 7.4 g/dL (ref 6.0–8.3)
Total Bilirubin: 2.2 mg/dL — ABNORMAL HIGH (ref 0.3–1.2)

## 2014-12-08 LAB — TROPONIN I: Troponin I: <0.03 ng/mL (ref <0.031)

## 2014-12-08 LAB — LIPASE, BLOOD: Lipase: 24 U/L (ref 11–59)

## 2014-12-08 MED ORDER — OXYCODONE HCL 5 MG PO TABS: 5.0000 mg | ORAL_TABLET | ORAL | Status: DC | PRN

## 2014-12-08 MED ORDER — ONDANSETRON HCL 4 MG/2ML IJ SOLN
INTRAMUSCULAR | Status: AC
  Administered 2014-12-08: 4 mg via INTRAVENOUS
  Filled 2014-12-08: qty 2

## 2014-12-08 MED ORDER — SODIUM CHLORIDE 0.9 % IV BOLUS (SEPSIS)
1000.0000 mL | Freq: Once | INTRAVENOUS | Status: AC
  Administered 2014-12-08: 1000 mL via INTRAVENOUS

## 2014-12-08 MED ORDER — POTASSIUM CHLORIDE CRYS ER 20 MEQ PO TBCR
40.0000 meq | EXTENDED_RELEASE_TABLET | Freq: Once | ORAL | Status: AC
  Administered 2014-12-08: 40 meq via ORAL
  Filled 2014-12-08: qty 2

## 2014-12-08 MED ORDER — ONDANSETRON HCL 4 MG/2ML IJ SOLN
4.0000 mg | Freq: Once | INTRAMUSCULAR | Status: AC
  Administered 2014-12-08: 4 mg via INTRAVENOUS

## 2014-12-08 MED ORDER — ONDANSETRON HCL 8 MG PO TABS: 8.0000 mg | ORAL_TABLET | Freq: Three times a day (TID) | ORAL | Status: DC | PRN

## 2014-12-08 NOTE — ED Notes (Signed)
US at bedside

## 2014-12-08 NOTE — ED Provider Notes (Signed)
CSN: [id number]     Arrival date & time 12/08/14  0730 History   First MD Initiated Contact with Patient 12/08/14 [phone]     Chief Complaint  Patient presents with  . Emesis     (Consider location/radiation/quality/duration/timing/severity/associated sxs/prior Treatment) The history is provided by the patient.   Adam Vaughan is a 44 y.o. male with past medical history of untreated hypertension and binge alcohol abuse, last binge occurred 11 days ago  presenting with a 2 day history of intermittent nausea, vomiting and upper abdominal pain.  He describes increasing generalized weakness and fatigue.  He has had multiple episodes of vomiting on Friday, was improved yesterday describing only 2 episodes of vomiting and was able to maintain by mouth fluids during the course of yesterday.  He woke up this morning with back-to-back vomiting once again.  He denies hematemesis.  Has had no diarrhea, last bowel movement was yesterday morning and soft but not diarrheal.  He has weakness and feels lightheaded with emesis in standing position.  He denies fevers or chills.  Abdominal pain is upper and cramping in character and improves after vomiting.  No surgical history.  He has had no medications for his symptoms.  He does endorse shortness of breath without chest pain which improves with resolution of vomiting and abdominal pain.      Past Medical History  Diagnosis Date  . Hypertension    History reviewed. No pertinent past surgical history. No family history on file. History  Substance Use Topics  . Smoking status: Never Smoker   . Smokeless tobacco: Current User    Types: Snuff  . Alcohol Use: Yes     Comment: hx of alcoholism, last etoh was 10 or 11 days ago    Review of Systems  Constitutional: Positive for fatigue. Negative for fever and chills.  HENT: Negative for congestion and sore throat.   Eyes: Negative.   Respiratory: Negative for chest tightness and shortness of breath.    Cardiovascular: Negative for chest pain.  Gastrointestinal: Positive for nausea, vomiting and abdominal pain. Negative for diarrhea.  Genitourinary: Negative.   Musculoskeletal: Negative for joint swelling, arthralgias and neck pain.  Skin: Negative.  Negative for rash and wound.  Neurological: Positive for weakness. Negative for dizziness, light-headedness, numbness and headaches.  Psychiatric/Behavioral: Negative.       Allergies  Dairy aid  Home Medications   Prior to Admission medications   Medication Sig Start Date End Date Taking? Authorizing Provider  aspirin-sod bicarb-citric acid (ALKA-SELTZER) 325 MG TBEF tablet Take 650 mg by mouth every 8 (eight) hours as needed (sleep).   Yes Historical Provider, MD  ondansetron (ZOFRAN) 8 MG tablet Take 1 tablet (8 mg total) by mouth every 8 (eight) hours as needed for nausea or vomiting. 12/08/14   Burgess Amor, PA-C  oxyCODONE (ROXICODONE) 5 MG immediate release tablet Take 1 tablet (5 mg total) by mouth every 4 (four) hours as needed for severe pain. 12/08/14   Burgess Amor, PA-C   BP 140/104 mmHg  Pulse 103  Temp(Src) 98.9 F (37.2 C) (Oral)  Resp 16  Ht  (1.778 m)  Wt 170 lb (77.111 kg)  BMI 24.39 kg/m2  SpO2 100% Physical Exam  Constitutional: He appears well-developed and well-nourished.  HENT:  Head: Normocephalic and atraumatic.  Mouth/Throat: Oropharynx is clear and moist. No oropharyngeal exudate.  Eyes: Conjunctivae are normal.  Neck: Normal range of motion.  Cardiovascular: Normal rate, regular rhythm, normal heart sounds and intact  distal pulses.   Pulmonary/Chest: Effort normal and breath sounds normal. He has no wheezes.  He is tender to palpation along anterior right lower rib cage.  Abdominal: Soft. Bowel sounds are normal. He exhibits no distension and no mass. There is tenderness in the right upper quadrant and epigastric area. There is no rebound and no guarding.  Mild ttp upper abd. No guard, no rebound.   Musculoskeletal: Normal range of motion.  Neurological: He is alert.  Skin: Skin is warm and dry.  Psychiatric: He has a normal mood and affect.  Nursing note and vitals reviewed.   ED Course  Procedures (including critical care time) Labs Review Labs Reviewed  CBC WITH DIFFERENTIAL/PLATELET - Abnormal; Notable for the following:    WBC 3.9 (*)    All other components within normal limits  URINALYSIS, ROUTINE W REFLEX MICROSCOPIC - Abnormal; Notable for the following:    Color, Urine AMBER (*)    Bilirubin Urine SMALL (*)    Urobilinogen, UA 4.0 (*)    All other components within normal limits  COMPREHENSIVE METABOLIC PANEL - Abnormal; Notable for the following:    Sodium 134 (*)    Potassium 3.1 (*)    Chloride 95 (*)    Glucose, Bld 116 (*)    Calcium 7.9 (*)    AST 733 (*)    ALT 338 (*)    Alkaline Phosphatase 179 (*)    Total Bilirubin 2.2 (*)    All other components within normal limits  LIPASE, BLOOD  TROPONIN I  HEPATITIS PANEL, ACUTE    Imaging Review [place] Abdomen Complete  12/08/2014   CLINICAL DATA:  Vomiting, abdominal cramping  EXAM: ULTRASOUND ABDOMEN COMPLETE  COMPARISON:  None.  FINDINGS: Gallbladder: Mild gallbladder distention. No gallbladder wall thickening or pericholecystic fluid. Negative sonographic Murphy's sign.  Common bile duct: Diameter: 4 mm  Liver: Hyperechoic hepatic parenchyma, suggesting hepatic steatosis, with focal fatty sparing adjacent to the gallbladder fossa. No focal hepatic lesion is seen.  IVC: No abnormality visualized.  Pancreas: Not discretely visualized due to overlying bowel gas.  Spleen: Size and appearance within normal limits.  Right Kidney: Length: 13.1 cm.  No mass or hydronephrosis.  Left Kidney: Length: 12.2 cm.  No mass or hydronephrosis.  Abdominal aorta: No aneurysm visualized.  Other findings: None.  IMPRESSION: Hepatic steatosis with focal fatty sparing.  Otherwise negative abdominal ultrasound.   Electronically Signed    By: Charline BillsSriyesh  Krishnan M.D.   On: 12/08/2014 11:04     EKG Interpretation   Date/Time:  Sunday December 08 2014 08:25:42 EDT Ventricular Rate:  86 PR Interval:  127 QRS Duration: 88 QT Interval:  413 QTC Calculation: 494 R Axis:   60 Text Interpretation:  Sinus rhythm Borderline prolonged QT interval Sinus  rhythm Artifact Abnormal ekg Confirmed by Gerhard MunchLOCKWOOD, ROBERT  MD ([id number]) on  12/08/2014 8:59:49 AM      MDM   Final diagnoses:  Acute alcoholic hepatitis    Patients labs and/or radiological studies were reviewed and considered during the medical decision making and disposition process.  Results were also discussed with patient. Pt with acute hematitis, presumptively solely due to etoh. He had no further emesis in ed, tolerated PO fluids.  He was prescribed zofran, oxy IR for pain relief. Advised to avoid tylenol.  F/u with pcp for a recheck this week, returning here for any worsened sx, intractable vomiting, fever, increased pain.  Discussed avoidance of etoh.  The patient appears reasonably screened and/or  stabilized for discharge and I doubt any other medical condition or other [facility] requiring further screening, evaluation, or treatment in the ED at this time prior to discharge.  Pt also had hypokalemia, with was replaced with PO k+.     Burgess Amor, PA-C 12/08/14 2241  Gerhard Munch, MD 12/11/14 [phone]

## 2014-12-08 NOTE — ED Notes (Signed)
Pt reports vomiting and intermittent abd cramping since Friday.  Denies diarrhea.  LBM was yesterday and was normal per pt.

## 2014-12-08 NOTE — Discharge Instructions (Signed)
Alcoholic Liver Disease °Alcoholic liver disease means you have a damaged liver that does not work properly. This disease is caused by drinking too much alcohol. Some people do not have any problems until the disease has become very bad. Problems can be worse after a period of heavy drinking. °HOME CARE °· Stop drinking alcohol. °· Get expert advice or help (counseling) to quit drinking. Join an alchohol support group. °· You may need to eat foods that give you energy (carbohydrates), such as: °¨ Milk and yogurt. °¨ Navy, pinto, and white beans. °¨ Applesauce, grapes, dried dates, prunes, and raisins. °¨ Potatoes and rice. °· Eat foods that are high in vitamins, especially thiamine and folic acid. These foods include: °¨ Whole-wheat or whole-grain breads and cereals. Look on the package for "added thiamine" or "added folic acid." °¨ Meat, especially pork. °¨ Fresh, raw vegetables. °¨ Fresh fruits and vegetables, such as oranges, orange juice, avocados, beets, and cantaloupe. °¨ Dark green, leafy vegetables, such as romaine lettuce, spinach, and broccoli. °¨ Beans and nuts. °¨ Dairy foods, such as milk, butter, yogurt, cheese, and ice cream. °GET HELP RIGHT AWAY IF:  °· You have bright red blood in your poop (stool). °· You cough or throw up (vomit) blood. °· Your skin and eyes turn more yellow. °· You have a bad headache or problems thinking. °· You have trouble walking. °MAKE SURE YOU:  °· Understand these instructions. °· Will watch your condition. °· Will get help right away if you are not doing well or get worse. °Document Released: 07/04/2009 Document Revised: 11/29/2011 Document Reviewed: 07/04/2009 °Exit[employer]® Patient Information ©2015 [employer]. This information is not intended to replace advice given to you by your health care provider. Make sure you discuss any questions you have with your health care provider. ° °

## 2014-12-08 NOTE — ED Notes (Signed)
Adam Vaughan at bedside. 

## 2014-12-09 LAB — HEPATITIS PANEL, ACUTE
HCV Ab: NEGATIVE
HEP A IGM: NONREACTIVE
HEP B C IGM: NONREACTIVE
HEP B S AG: NEGATIVE

## 2014-12-22 ENCOUNTER — Encounter (HOSPITAL_COMMUNITY): Payer: Self-pay | Admitting: Emergency Medicine

## 2014-12-22 ENCOUNTER — Emergency Department (HOSPITAL_COMMUNITY)
Admission: EM | Admit: 2014-12-22 | Discharge: 2014-12-22 | Disposition: A | Discharge status: Home / Self Care | Payer: BLUE CROSS/BLUE SHIELD | Attending: Emergency Medicine | Admitting: Emergency Medicine

## 2014-12-22 ENCOUNTER — Emergency Department (HOSPITAL_COMMUNITY): Payer: BLUE CROSS/BLUE SHIELD

## 2014-12-22 DIAGNOSIS — Y998 Other external cause status: Secondary | ICD-10-CM | POA: Diagnosis not present

## 2014-12-22 DIAGNOSIS — S298XXA Other specified injuries of thorax, initial encounter: Secondary | ICD-10-CM

## 2014-12-22 DIAGNOSIS — S299XXA Unspecified injury of thorax, initial encounter: Secondary | ICD-10-CM | POA: Diagnosis not present

## 2014-12-22 DIAGNOSIS — Y9389 Activity, other specified: Secondary | ICD-10-CM | POA: Insufficient documentation

## 2014-12-22 DIAGNOSIS — I10 Essential (primary) hypertension: Secondary | ICD-10-CM | POA: Diagnosis not present

## 2014-12-22 DIAGNOSIS — Y92410 Unspecified street and highway as the place of occurrence of the external cause: Secondary | ICD-10-CM | POA: Insufficient documentation

## 2014-12-22 NOTE — Discharge Instructions (Signed)
Blunt Chest Trauma °Blunt chest trauma is an injury caused by a blow to the chest. These chest injuries can be very painful. Blunt chest trauma often results in bruised or broken (fractured) ribs. Most cases of bruised and fractured ribs from blunt chest traumas get better after 1 to 3 weeks of rest and pain medicine. Often, the soft tissue in the chest wall is also injured, causing pain and bruising. Internal organs, such as the heart and lungs, may also be injured. Blunt chest trauma can lead to serious medical problems. This injury requires immediate medical care. °CAUSES  °· Motor vehicle collisions. °· Falls. °· Physical violence. °· Sports injuries. °SYMPTOMS  °· Chest pain. The pain may be worse when you move or breathe deeply. °· Shortness of breath. °· Lightheadedness. °· Bruising. °· Tenderness. °· Swelling. °DIAGNOSIS  °Your caregiver will do a physical exam. X-rays may be taken to look for fractures. However, minor rib fractures may not show up on X-rays until a few days after the injury. If a more serious injury is suspected, further imaging tests may be done. This may include ultrasounds, computed tomography (CT) scans, or magnetic resonance imaging (MRI). °TREATMENT  °Treatment depends on the severity of your injury. Your caregiver may prescribe pain medicines and deep breathing exercises. °HOME CARE INSTRUCTIONS °· Limit your activities until you can move around without much pain. °· Do not do any strenuous work until your injury is healed. °· Put ice on the injured area. °¨ Put ice in a plastic bag. °¨ Place a towel between your skin and the bag. °¨ Leave the ice on for 15-20 minutes, 03-04 times a day. °· You may wear a rib belt as directed by your caregiver to reduce pain. °· Practice deep breathing as directed by your caregiver to keep your lungs clear. °· Only take over-the-counter or prescription medicines for pain, fever, or discomfort as directed by your caregiver. °SEEK IMMEDIATE MEDICAL  CARE IF:  °· You have increasing pain or shortness of breath. °· You cough up blood. °· You have nausea, vomiting, or abdominal pain. °· You have a fever. °· You feel dizzy, weak, or you faint. °MAKE SURE YOU: °· Understand these instructions. °· Will watch your condition. °· Will get help right away if you are not doing well or get worse. °Document Released: 10/14/2004 Document Revised: 11/29/2011 Document Reviewed: 06/23/2011 °[employer]® Patient Information ©2015 [employer]. This information is not intended to replace advice given to you by your health care provider. Make sure you discuss any questions you have with your health care provider. ° ° °
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
## 2014-12-22 NOTE — ED Notes (Addendum)
[place] police at bedside.
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
## 2014-12-22 NOTE — ED Notes (Signed)
Patient transported home home by girlfriend.

## 2015-03-12 ENCOUNTER — Emergency Department (HOSPITAL_COMMUNITY): Payer: Worker's Compensation

## 2015-03-12 ENCOUNTER — Encounter (HOSPITAL_COMMUNITY): Payer: Self-pay | Admitting: Emergency Medicine

## 2015-03-12 ENCOUNTER — Emergency Department (HOSPITAL_COMMUNITY)
Admission: EM | Admit: 2015-03-12 | Discharge: 2015-03-12 | Disposition: A | Discharge status: Home / Self Care | Payer: Worker's Compensation | Attending: Emergency Medicine | Admitting: Emergency Medicine

## 2015-03-12 DIAGNOSIS — Y990 Civilian activity done for income or pay: Secondary | ICD-10-CM | POA: Insufficient documentation

## 2015-03-12 DIAGNOSIS — I10 Essential (primary) hypertension: Secondary | ICD-10-CM | POA: Insufficient documentation

## 2015-03-12 DIAGNOSIS — W01198A Fall on same level from slipping, tripping and stumbling with subsequent striking against other object, initial encounter: Secondary | ICD-10-CM | POA: Insufficient documentation

## 2015-03-12 DIAGNOSIS — Y9289 Other specified places as the place of occurrence of the external cause: Secondary | ICD-10-CM | POA: Insufficient documentation

## 2015-03-12 DIAGNOSIS — S61411A Laceration without foreign body of right hand, initial encounter: Secondary | ICD-10-CM

## 2015-03-12 DIAGNOSIS — Y9389 Activity, other specified: Secondary | ICD-10-CM | POA: Insufficient documentation

## 2015-03-12 DIAGNOSIS — Z23 Encounter for immunization: Secondary | ICD-10-CM | POA: Insufficient documentation

## 2015-03-12 MED ORDER — LIDOCAINE HCL (PF) 2 % IJ SOLN
10.0000 mL | Freq: Once | INTRAMUSCULAR | Status: AC
  Administered 2015-03-12: 10 mL
  Filled 2015-03-12: qty 10

## 2015-03-12 MED ORDER — TETANUS-DIPHTH-ACELL PERTUSSIS 5-2.5-18.5 LF-MCG/0.5 IM SUSP
0.5000 mL | Freq: Once | INTRAMUSCULAR | Status: AC
  Administered 2015-03-12: 0.5 mL via INTRAMUSCULAR
  Filled 2015-03-12: qty 0.5

## 2015-03-12 NOTE — ED Provider Notes (Signed)
CSN: [id number]     Arrival date & time 03/12/15  1433 History   First MD Initiated Contact with Patient 03/12/15 1441     Chief Complaint  Patient presents with  . Laceration     (Consider location/radiation/quality/duration/timing/severity/associated sxs/prior Treatment) HPI Comments: Pt states that he slipped on something at work and he tried to brace his fall and he got a laceration to his right hand. Denies numbness or weakness. Has full rom. No previous injury. Tetanus is not utd..  The history is provided by the patient. No language interpreter was used.    Past Medical History  Diagnosis Date  . Hypertension    History reviewed. No pertinent past surgical history. History reviewed. No pertinent family history. History  Substance Use Topics  . Smoking status: Never Smoker   . Smokeless tobacco: Current User    Types: Snuff  . Alcohol Use: No    Review of Systems  All other systems reviewed and are negative.     Allergies  Dairy aid  Home Medications   Prior to Admission medications   Medication Sig Start Date End Date Taking? Authorizing Provider  aspirin-sod bicarb-citric acid (ALKA-SELTZER) 325 MG TBEF tablet Take 650 mg by mouth every 8 (eight) hours as needed (sleep).    Historical Provider, MD  ondansetron (ZOFRAN) 8 MG tablet Take 1 tablet (8 mg total) by mouth every 8 (eight) hours as needed for nausea or vomiting. 12/08/14   Burgess Amor, PA-C  oxyCODONE (ROXICODONE) 5 MG immediate release tablet Take 1 tablet (5 mg total) by mouth every 4 (four) hours as needed for severe pain. 12/08/14   Burgess Amor, PA-C   BP 139/94 mmHg  Pulse 65  Temp(Src) 98.6 F (37 C) (Oral)  Resp 18  Ht  (1.778 m)  Wt 191 lb (86.637 kg)  BMI 27.41 kg/m2  SpO2 97% Physical Exam  Constitutional: He is oriented to person, place, and time. He appears well-developed and well-nourished.  Cardiovascular: Normal rate and regular rhythm.   Pulmonary/Chest: Effort normal and  breath sounds normal.  Musculoskeletal:  Full rom of fingers  Neurological: He is alert and oriented to person, place, and time. He exhibits normal muscle tone. Coordination normal.  Skin:  Laceration to base of the ring finger on the right side  Nursing note and vitals reviewed.   ED Course  LACERATION REPAIR Date/Time: 03/12/2015 3:30 PM Performed by: Teressa Lower Authorized by: Teressa Lower Consent: Verbal consent obtained. Consent given by: patient Patient identity confirmed: verbally with patient Body area: upper extremity Location details: right hand Laceration length: 3 cm Foreign bodies: no foreign bodies Anesthesia: local infiltration Local anesthetic: lidocaine 2% without epinephrine Irrigation solution: saline Skin closure: 4-0 Prolene Number of sutures: 7 Technique: simple Approximation: close Approximation difficulty: simple Patient tolerance: Patient tolerated the procedure well with no immediate complications   (including critical care time) Labs Review Labs Reviewed - No data to display  Imaging Review Dg Hand Complete Right  03/12/2015   CLINICAL DATA:  Laceration at the base of ring finger  EXAM: RIGHT HAND - COMPLETE 3+ VIEW  COMPARISON:  None.  FINDINGS: Three views of the right hand submitted. No acute fracture or subluxation. There is soft tissue irregularity probable laceration of the base of third finger and between third and fourth finger. There is a sclerotic line with cortical irregularity mid aspect of scaphoid consistent with nondisplaced fracture of indeterminate age. Clinical correlation is necessary.  IMPRESSION: No acute fracture or  subluxation. There is soft tissue irregularity probable laceration of the base of third finger and between third and fourth finger. There is a sclerotic line with cortical irregularity mid aspect of scaphoid consistent with nondisplaced fracture of indeterminate age. Clinical correlation is necessary.    Electronically Signed   By: Natasha Mead M.D.   On: 03/12/2015 15:06     EKG Interpretation None      MDM   Final diagnoses:  Hand laceration, right, initial encounter    Pt not tender along scaphoid. Wound closed without any problem. Discussed wound care with pt    Teressa Lower, NP 03/12/15 1531  Raeford Razor, MD 03/13/15 [phone]

## 2015-03-12 NOTE — ED Notes (Signed)
Pt states that he tripped and fell backwards onto buttocks.  Denies complaints related to fall other than hit right hand on rack causing a laceration.

## 2015-03-12 NOTE — Discharge Instructions (Signed)
Have the sutures remove in 10-14 days as discussed. Return sooner for any sign of infection Laceration Care, Adult A laceration is a cut that goes through all layers of the skin. The cut goes into the tissue beneath the skin. HOME CARE For stitches (sutures) or staples:  Keep the cut clean and dry.  If you have a bandage (dressing), change it at least once a day. Change the bandage if it gets wet or dirty, or as told by your doctor.  Wash the cut with soap and water 2 times a day. Rinse the cut with water. Pat it dry with a clean towel.  Put a thin layer of medicated cream on the cut as told by your doctor.  You may shower after the first 24 hours. Do not soak the cut in water until the stitches are removed.  Only take medicines as told by your doctor.  Have your stitches or staples removed as told by your doctor. For skin adhesive strips:  Keep the cut clean and dry.  Do not get the strips wet. You may take a bath, but be careful to keep the cut dry.  If the cut gets wet, pat it dry with a clean towel.  The strips will fall off on their own. Do not remove the strips that are still stuck to the cut. For wound glue:  You may shower or take baths. Do not soak or scrub the cut. Do not swim. Avoid heavy sweating until the glue falls off on its own. After a shower or bath, pat the cut dry with a clean towel.  Do not put medicine on your cut until the glue falls off.  If you have a bandage, do not put tape over the glue.  Avoid lots of sunlight or tanning lamps until the glue falls off. Put sunscreen on the cut for the first year to reduce your scar.  The glue will fall off on its own. Do not pick at the glue. You may need a tetanus shot if:  You cannot remember when you had your last tetanus shot.  You have never had a tetanus shot. If you need a tetanus shot and you choose not to have one, you may get tetanus. Sickness from tetanus can be serious. GET HELP RIGHT AWAY IF:    Your pain does not get better with medicine.  Your arm, hand, leg, or foot loses feeling (numbness) or changes color.  Your cut is bleeding.  Your joint feels weak, or you cannot use your joint.  You have painful lumps on your body.  Your cut is red, puffy (swollen), or painful.  You have a red line on the skin near the cut.  You have yellowish-white fluid (pus) coming from the cut.  You have a fever.  You have a bad smell coming from the cut or bandage.  Your cut breaks open before or after stitches are removed.  You notice something coming out of the cut, such as wood or glass.  You cannot move a finger or toe. MAKE SURE YOU:   Understand these instructions.  Will watch your condition.  Will get help right away if you are not doing well or get worse. Document Released: 02/23/2008 Document Revised: 11/29/2011 Document Reviewed: 03/02/2011 [facility] Patient Information 2015 [place], [place]. This information is not intended to replace advice given to you by your health care provider. Make sure you discuss any questions you have with your health care provider.

## 2016-02-18 ENCOUNTER — Encounter (HOSPITAL_COMMUNITY): Payer: Self-pay

## 2016-02-18 ENCOUNTER — Inpatient Hospital Stay (HOSPITAL_COMMUNITY)
Admission: EM | Admit: 2016-02-18 | Discharge: 2016-02-19 | DRG: 897 | Disposition: A | Discharge status: Home / Self Care | Payer: BLUE CROSS/BLUE SHIELD | Attending: Pulmonary Disease | Admitting: Pulmonary Disease

## 2016-02-18 ENCOUNTER — Emergency Department (HOSPITAL_COMMUNITY): Payer: BLUE CROSS/BLUE SHIELD

## 2016-02-18 DIAGNOSIS — F17290 Nicotine dependence, other tobacco product, uncomplicated: Secondary | ICD-10-CM | POA: Diagnosis present

## 2016-02-18 DIAGNOSIS — I10 Essential (primary) hypertension: Secondary | ICD-10-CM | POA: Diagnosis present

## 2016-02-18 DIAGNOSIS — F10129 Alcohol abuse with intoxication, unspecified: Secondary | ICD-10-CM | POA: Diagnosis not present

## 2016-02-18 DIAGNOSIS — E861 Hypovolemia: Secondary | ICD-10-CM | POA: Diagnosis present

## 2016-02-18 DIAGNOSIS — Y908 Blood alcohol level of 240 mg/100 ml or more: Secondary | ICD-10-CM | POA: Diagnosis present

## 2016-02-18 DIAGNOSIS — F17299 Nicotine dependence, other tobacco product, with unspecified nicotine-induced disorders: Secondary | ICD-10-CM | POA: Diagnosis present

## 2016-02-18 DIAGNOSIS — J9690 Respiratory failure, unspecified, unspecified whether with hypoxia or hypercapnia: Secondary | ICD-10-CM

## 2016-02-18 DIAGNOSIS — Z79899 Other long term (current) drug therapy: Secondary | ICD-10-CM

## 2016-02-18 DIAGNOSIS — Z888 Allergy status to other drugs, medicaments and biological substances status: Secondary | ICD-10-CM

## 2016-02-18 DIAGNOSIS — F10929 Alcohol use, unspecified with intoxication, unspecified: Secondary | ICD-10-CM

## 2016-02-18 DIAGNOSIS — R402432 Glasgow coma scale score 3-8, at arrival to emergency department: Secondary | ICD-10-CM

## 2016-02-18 DIAGNOSIS — I959 Hypotension, unspecified: Secondary | ICD-10-CM | POA: Diagnosis present

## 2016-02-18 LAB — BLOOD GAS, ARTERIAL
ACID-BASE EXCESS: 3.2 mmol/L — AB (ref 0.0–2.0)
BICARBONATE: 22.2 meq/L (ref 20.0–24.0)
Drawn by: 317771
FIO2: 0.6
LHR: 15 {breaths}/min
O2 CONTENT: 60 L/min
O2 Saturation: 96.2 %
PCO2 ART: 33.5 mmHg — AB (ref 35.0–45.0)
PEEP/CPAP: 5 cmH2O
TCO2: 19.3 mmol/L (ref 0–100)
VT: 580 mL
pH, Arterial: 7.408 (ref 7.350–7.450)
pO2, Arterial: 93.3 mmHg (ref 80.0–100.0)

## 2016-02-18 LAB — URINALYSIS, ROUTINE W REFLEX MICROSCOPIC
BILIRUBIN URINE: NEGATIVE
Glucose, UA: NEGATIVE mg/dL
Hgb urine dipstick: NEGATIVE
KETONES UR: NEGATIVE mg/dL
Leukocytes, UA: NEGATIVE
Nitrite: NEGATIVE
PROTEIN: NEGATIVE mg/dL
Specific Gravity, Urine: >1.03 — ABNORMAL HIGH (ref 1.005–1.030)
pH: 5.5 (ref 5.0–8.0)

## 2016-02-18 LAB — CBC WITH DIFFERENTIAL/PLATELET
Basophils Absolute: 0 10*3/uL (ref 0.0–0.1)
Basophils Relative: 0 %
EOS PCT: 1 %
Eosinophils Absolute: 0.1 10*3/uL (ref 0.0–0.7)
HEMATOCRIT: 42.6 % (ref 39.0–52.0)
Hemoglobin: 14.3 g/dL (ref 13.0–17.0)
LYMPHS PCT: 26 %
Lymphs Abs: 1.7 10*3/uL (ref 0.7–4.0)
MCH: 27.3 pg (ref 26.0–34.0)
MCHC: 33.6 g/dL (ref 30.0–36.0)
MCV: 81.5 fL (ref 78.0–100.0)
MONO ABS: 0.2 10*3/uL (ref 0.1–1.0)
Monocytes Relative: 3 %
NEUTROS ABS: 4.7 10*3/uL (ref 1.7–7.7)
Neutrophils Relative %: 70 %
Platelets: 259 10*3/uL (ref 150–400)
RBC: 5.23 MIL/uL (ref 4.22–5.81)
RDW: 13.4 % (ref 11.5–15.5)
WBC: 6.8 10*3/uL (ref 4.0–10.5)

## 2016-02-18 LAB — I-STAT CHEM 8, ED
BUN: 12 mg/dL (ref 6–20)
CALCIUM ION: 0.99 mmol/L — AB (ref 1.12–1.23)
CREATININE: 1.1 mg/dL (ref 0.61–1.24)
Chloride: 105 mmol/L (ref 101–111)
Glucose, Bld: 131 mg/dL — ABNORMAL HIGH (ref 65–99)
HCT: 46 % (ref 39.0–52.0)
Hemoglobin: 15.6 g/dL (ref 13.0–17.0)
Potassium: 3.7 mmol/L (ref 3.5–5.1)
Sodium: 143 mmol/L (ref 135–145)
TCO2: 21 mmol/L (ref 0–100)

## 2016-02-18 LAB — RAPID URINE DRUG SCREEN, HOSP PERFORMED
Amphetamines: NOT DETECTED
Barbiturates: NOT DETECTED
Benzodiazepines: NOT DETECTED
COCAINE: NOT DETECTED
OPIATES: NOT DETECTED
TETRAHYDROCANNABINOL: NOT DETECTED

## 2016-02-18 LAB — COMPREHENSIVE METABOLIC PANEL
ALBUMIN: 4 g/dL (ref 3.5–5.0)
ALT: 35 U/L (ref 17–63)
AST: 28 U/L (ref 15–41)
Alkaline Phosphatase: 55 U/L (ref 38–126)
Anion gap: 10 (ref 5–15)
BILIRUBIN TOTAL: 0.4 mg/dL (ref 0.3–1.2)
BUN: 13 mg/dL (ref 6–20)
CO2: 21 mmol/L — ABNORMAL LOW (ref 22–32)
CREATININE: 0.71 mg/dL (ref 0.61–1.24)
Calcium: 7.8 mg/dL — ABNORMAL LOW (ref 8.9–10.3)
Chloride: 107 mmol/L (ref 101–111)
GFR calc Af Amer: >60 mL/min (ref >60)
GFR calc non Af Amer: >60 mL/min (ref >60)
GLUCOSE: 131 mg/dL — AB (ref 65–99)
POTASSIUM: 3.6 mmol/L (ref 3.5–5.1)
Sodium: 138 mmol/L (ref 135–145)
Total Protein: 7.5 g/dL (ref 6.5–8.1)

## 2016-02-18 LAB — LIPASE, BLOOD: Lipase: 20 U/L (ref 11–51)

## 2016-02-18 LAB — I-STAT TROPONIN, ED: Troponin i, poc: 0 ng/mL (ref 0.00–0.08)

## 2016-02-18 LAB — SALICYLATE LEVEL: Salicylate Lvl: <4 mg/dL (ref 2.8–30.0)

## 2016-02-18 LAB — I-STAT CG4 LACTIC ACID, ED: Lactic Acid, Venous: 1.88 mmol/L (ref 0.5–2.0)

## 2016-02-18 LAB — ETHANOL: Alcohol, Ethyl (B): 418 mg/dL (ref <5)

## 2016-02-18 LAB — ACETAMINOPHEN LEVEL: Acetaminophen (Tylenol), Serum: <10 ug/mL — ABNORMAL LOW (ref 10–30)

## 2016-02-18 MED ORDER — ETOMIDATE 2 MG/ML IV SOLN
20.0000 mg | Freq: Once | INTRAVENOUS | Status: AC
  Administered 2016-02-18: 20 mg via INTRAVENOUS

## 2016-02-18 MED ORDER — SUCCINYLCHOLINE CHLORIDE 20 MG/ML IJ SOLN
100.0000 mg | Freq: Once | INTRAMUSCULAR | Status: AC
  Administered 2016-02-18: 100 mg via INTRAVENOUS

## 2016-02-18 MED ORDER — MIDAZOLAM HCL 10 MG/2ML IJ SOLN
INTRAMUSCULAR | Status: AC
  Administered 2016-02-18: 5 mg
  Filled 2016-02-18: qty 2

## 2016-02-18 MED ORDER — MIDAZOLAM HCL 5 MG/5ML IJ SOLN
5.0000 mg | Freq: Once | INTRAMUSCULAR | Status: AC
  Administered 2016-02-18: 5 mg via INTRAVENOUS

## 2016-02-18 MED ORDER — SODIUM CHLORIDE 0.9 % IV SOLN: INTRAVENOUS | Status: DC

## 2016-02-18 MED ORDER — VANCOMYCIN HCL IN DEXTROSE 1-5 GM/200ML-% IV SOLN
1000.0000 mg | Freq: Three times a day (TID) | INTRAVENOUS | Status: DC
Start: 2016-02-19 — End: 2016-02-19
  Filled 2016-02-18: qty 200

## 2016-02-18 MED ORDER — MIDAZOLAM HCL 50 MG/10ML IJ SOLN
INTRAMUSCULAR | Status: AC
  Filled 2016-02-18: qty 1

## 2016-02-18 MED ORDER — PIPERACILLIN-TAZOBACTAM 3.375 G IVPB
3.3750 g | Freq: Three times a day (TID) | INTRAVENOUS | Status: DC
Start: 2016-02-19 — End: 2016-02-19
  Filled 2016-02-18 (×2): qty 50

## 2016-02-18 MED ORDER — PIPERACILLIN-TAZOBACTAM 3.375 G IVPB 30 MIN
3.3750 g | Freq: Once | INTRAVENOUS | Status: AC
  Administered 2016-02-18: 3.375 g via INTRAVENOUS
  Filled 2016-02-18: qty 50

## 2016-02-18 MED ORDER — NALOXONE HCL 2 MG/2ML IJ SOSY
PREFILLED_SYRINGE | INTRAMUSCULAR | Status: AC
  Administered 2016-02-18: 2 mg via INTRAVENOUS
  Filled 2016-02-18: qty 2

## 2016-02-18 MED ORDER — SODIUM CHLORIDE 0.9 % IV BOLUS (SEPSIS)
500.0000 mL | Freq: Once | INTRAVENOUS | Status: AC
  Administered 2016-02-18: 500 mL via INTRAVENOUS

## 2016-02-18 MED ORDER — SODIUM CHLORIDE 0.9 % IV BOLUS (SEPSIS)
1000.0000 mL | Freq: Once | INTRAVENOUS | Status: AC
  Administered 2016-02-19: 1000 mL via INTRAVENOUS

## 2016-02-18 MED ORDER — SODIUM CHLORIDE 0.9 % IV BOLUS (SEPSIS)
1000.0000 mL | Freq: Once | INTRAVENOUS | Status: AC
  Administered 2016-02-18: 1000 mL via INTRAVENOUS

## 2016-02-18 MED ORDER — VANCOMYCIN HCL IN DEXTROSE 1-5 GM/200ML-% IV SOLN
1000.0000 mg | Freq: Once | INTRAVENOUS | Status: AC
  Administered 2016-02-18: 1000 mg via INTRAVENOUS
  Filled 2016-02-18: qty 200

## 2016-02-18 MED ORDER — NALOXONE HCL 2 MG/2ML IJ SOSY
2.0000 mg | PREFILLED_SYRINGE | Freq: Once | INTRAMUSCULAR | Status: AC
  Administered 2016-02-18: 2 mg via INTRAVENOUS

## 2016-02-18 MED ORDER — SODIUM CHLORIDE 0.9 % IV SOLN
1.0000 mg/h | INTRAVENOUS | Status: DC
Start: 2016-02-18 — End: 2016-02-19
  Administered 2016-02-18: 1 mg/h via INTRAVENOUS
  Filled 2016-02-18: qty 10

## 2016-02-18 MED ORDER — SODIUM CHLORIDE 0.9 % IV BOLUS (SEPSIS)
1000.0000 mL | Freq: Once | INTRAVENOUS | Status: AC
  Administered 2016-02-18: 500 mL via INTRAVENOUS

## 2016-02-18 NOTE — ED Notes (Signed)
Pt brought in by ems for unresponsive at home, given narcan x 2 with some brief responsiveness.  Unknown if pt took meds or overdosed on anything.  Family denies abuse of drugs.

## 2016-02-18 NOTE — Progress Notes (Signed)
Pharmacy Note:  Initial antibiotics for Vancomycin and Zosyn ordered by EDP for [place].  CrCl cannot be calculated (Unknown ideal weight.).  Previous height and weight noted from previous admission. BMET    Component Value Date/Time   NA 143 02/18/2016 2139   K 3.7 02/18/2016 2139   CL 105 02/18/2016 2139   CO2 21* 02/18/2016 2132   GLUCOSE 131* 02/18/2016 2139   BUN 12 02/18/2016 2139   CREATININE 1.10 02/18/2016 2139   CALCIUM 7.8* 02/18/2016 2132   GFRNONAA >60 02/18/2016 2132   GFRAA >60 02/18/2016 2132      Allergies  Allergen Reactions  . Dairy Aid [Lactase] Diarrhea and Nausea And Vomiting    Filed Vitals:   02/18/16 2200 02/18/16 2207  BP: 83/62 90/60  Pulse: 105   Temp: 97.2 F (36.2 C)   Resp: 15     Anti-infectives    Start     Dose/Rate Route Frequency Ordered Stop   02/18/16 2215  piperacillin-tazobactam (ZOSYN) IVPB 3.375 g     3.375 g 100 mL/hr over 30 Minutes Intravenous  Once 02/18/16 2211     02/18/16 2215  vancomycin (VANCOCIN) IVPB 1000 mg/200 mL premix     1,000 mg 200 mL/hr over 60 Minutes Intravenous  Once 02/18/16 2211        Plan: Initial doses of Vancomycin and Zosyn X 1 ordered by EDP. Admission orders for further dosing if therapy continued entered for Vancomycin and Zosyn.  Zosyn 3.375gm IV every 8 hours. Follow-up micro data, labs, vitals. Vancomycin 1gm IV every 8 hours. Goal Trough 15-20 Monitor labs, micro and vitals.   Mady GemmaHayes, Aayushi Solorzano R, [facility] Va Medical CenterRPH 02/18/2016 10:36 PM

## 2016-02-18 NOTE — ED Provider Notes (Signed)
CSN: [id number]     Arrival date & time 02/18/16  2046 History  By signing my name below, I, Adam Vaughan, attest that this documentation has been prepared under the direction and in the presence of [name], MD. Electronically Signed: [name], ED Scribe. 02/18/2016. 9:16 PM.   Chief Complaint  Patient presents with  . Unresponsive    LEVEL 5 CAVEAT: UNRESPONSIVE  The history is provided by the EMS personnel. No language interpreter was used.    HPI Comments: Adam FreerJohnny Vaughan is a 45 y.o. male who presents to the Emergency Department via EMS due to being unresponsive onset PTA. Hx provided by EMS who states that the pt was found unresponsive at home by his significant other who called EMS. EMS gave the pt 2 mg narcan and the pt was able to roll to the stretcher and verbalize to his significant other "F you." EMS asked the pt what he took and the pt smirk and shrugged his shoulders. Pt then went out again and was given 2 mg narcan again by EMS. Pt significant other states that that the pt didnt complain of pain. EMS denies any other symptoms.   Past Medical History  Diagnosis Date  . Hypertension    History reviewed. No pertinent past surgical history. No family history on file. Social History  Substance Use Topics  . Smoking status: Never Smoker   . Smokeless tobacco: Current User    Types: Snuff  . 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    Review of Systems  Unable to perform ROS: Patient unresponsive    Allergies  Dairy aid  Home Medications   Prior to Admission medications   Medication Sig Start Date End Date Taking? Authorizing Provider  amLODipine (NORVASC) 5 MG tablet Take 1 tablet by mouth daily. 02/13/15   Historical Provider, MD  traZODone (DESYREL) 50 MG tablet Take 1 tablet by mouth at bedtime. 02/13/15   Historical Provider, MD   BP 106/81 mmHg  Pulse 101  Temp(Src) 96.6 F (35.9 C)  Resp 15  SpO2  99% Physical Exam  Constitutional: He appears well-developed and well-nourished. No distress.  HENT:  Head: Normocephalic and atraumatic.  Eyes: EOM are normal.  Pupils are measuring 4 mm bilaterally and pinpoint  Neck: Neck supple.  Cardiovascular: Normal rate, regular rhythm and normal heart sounds.  Exam reveals no gallop and no friction rub.   No murmur heard. Pulmonary/Chest: Effort normal and breath sounds normal. No respiratory distress. He has no wheezes. He has no rales.  Bilateral breath sounds  Abdominal: Soft. Bowel sounds are normal.  Musculoskeletal: Normal range of motion.  No swelling to ankles  Neurological:  Movement observed prior to intubation was of the head/neck .   Skin: Skin is warm and dry.  Psychiatric: He has a normal mood and affect. His behavior is normal.  Nursing note and vitals reviewed.   ED Course  Procedures (including critical care time) DIAGNOSTIC STUDIES: Oxygen Saturation is 96% on RA, nl by my interpretation.    COORDINATION OF CARE: 9:12 PM Discussed treatment plan with pt at bedside which includes intubation, UDS, labs, CXR, and pt agreed to plan.   EMERGENT INTUBATION PROCEDURE NOTE INDICATION: airway compromise  TECHNIQUE: Unable to obtain consent because of emergent medical necessity.  After pre-oxygenating the patient for 5 minutes, a modified rapid-sequence induction was performed using etomidate and succinylcholine with cricoid pressure. Using a Macintosh 4 laryngoscope  blade and 7.25mm cuffed endotracheal tube was placed and secured.  Placement was confirmed with by CXR.  COMPLICATIONS: None. The patient tolerated the procedure well with no complications. POST PROCEDURE CXR: tube position high-repositioned but acceptable.    Labs Review Labs Reviewed  URINALYSIS, ROUTINE W REFLEX MICROSCOPIC (NOT AT [facility]) - Abnormal; Notable for the following:    Specific Gravity, Urine >1.030 (*)    All other components within normal limits   COMPREHENSIVE METABOLIC PANEL - Abnormal; Notable for the following:    CO2 21 (*)    Glucose, Bld 131 (*)    Calcium 7.8 (*)    All other components within normal limits  ETHANOL - Abnormal; Notable for the following:    Alcohol, Ethyl (B) 418 (*)    All other components within normal limits  ACETAMINOPHEN LEVEL - Abnormal; Notable for the following:    Acetaminophen (Tylenol), Serum <10 (*)    All other components within normal limits  BLOOD GAS, ARTERIAL - Abnormal; Notable for the following:    pCO2 arterial 33.5 (*)    Acid-Base Excess 3.2 (*)    All other components within normal limits  I-STAT CHEM 8, ED - Abnormal; Notable for the following:    Glucose, Bld 131 (*)    Calcium, Ion 0.99 (*)    All other components within normal limits  CULTURE, BLOOD (ROUTINE X 2)  CULTURE, BLOOD (ROUTINE X 2)  URINE CULTURE  URINE RAPID DRUG SCREEN, HOSP PERFORMED  LIPASE, BLOOD  CBC WITH DIFFERENTIAL/PLATELET  SALICYLATE LEVEL  BASIC METABOLIC PANEL  I-STAT TROPOININ, ED  I-STAT CG4 LACTIC ACID, ED   Results for orders placed or performed during the hospital encounter of 02/18/16  Urinalysis, Routine w reflex microscopic (not at [facility])  Result Value Ref Range   Color, Urine YELLOW YELLOW   APPearance CLEAR CLEAR   Specific Gravity, Urine >1.030 (H) 1.005 - 1.030   pH 5.5 5.0 - 8.0   Glucose, UA NEGATIVE NEGATIVE mg/dL   Hgb urine dipstick NEGATIVE NEGATIVE   Bilirubin Urine NEGATIVE NEGATIVE   Ketones, ur NEGATIVE NEGATIVE mg/dL   Protein, ur NEGATIVE NEGATIVE mg/dL   Nitrite NEGATIVE NEGATIVE   Leukocytes, UA NEGATIVE NEGATIVE  Urine rapid drug screen (hosp performed)  Result Value Ref Range   Opiates NONE DETECTED NONE DETECTED   Cocaine NONE DETECTED NONE DETECTED   Benzodiazepines NONE DETECTED NONE DETECTED   Amphetamines NONE DETECTED NONE DETECTED   Tetrahydrocannabinol NONE DETECTED NONE DETECTED   Barbiturates NONE DETECTED NONE DETECTED  Comprehensive metabolic  panel  Result Value Ref Range   Sodium 138 135 - 145 mmol/L   Potassium 3.6 3.5 - 5.1 mmol/L   Chloride 107 101 - 111 mmol/L   CO2 21 (L) 22 - 32 mmol/L   Glucose, Bld 131 (H) 65 - 99 mg/dL   BUN 13 6 - 20 mg/dL   Creatinine, Ser 1.61 0.61 - 1.24 mg/dL   Calcium 7.8 (L) 8.9 - 10.3 mg/dL   Total Protein 7.5 6.5 - 8.1 g/dL   Albumin 4.0 3.5 - 5.0 g/dL   AST 28 15 - 41 U/L   ALT 35 17 - 63 U/L   Alkaline Phosphatase 55 38 - 126 U/L   Total Bilirubin 0.4 0.3 - 1.2 mg/dL   GFR calc non Af Amer >60 >60 mL/min   GFR calc Af Amer >60 >60 mL/min   Anion gap 10 5 - 15  Lipase, blood  Result Value Ref Range  Lipase 20 11 - 51 U/L  CBC with Differential/Platelet  Result Value Ref Range   WBC 6.8 4.0 - 10.5 K/uL   RBC 5.23 4.22 - 5.81 MIL/uL   Hemoglobin 14.3 13.0 - 17.0 g/dL   HCT 16.1 09.6 - 04.5 %   MCV 81.5 78.0 - 100.0 fL   MCH 27.3 26.0 - 34.0 pg   MCHC 33.6 30.0 - 36.0 g/dL   RDW 40.9 81.1 - 91.4 %   Platelets 259 150 - 400 K/uL   Neutrophils Relative % 70 %   Neutro Abs 4.7 1.7 - 7.7 K/uL   Lymphocytes Relative 26 %   Lymphs Abs 1.7 0.7 - 4.0 K/uL   Monocytes Relative 3 %   Monocytes Absolute 0.2 0.1 - 1.0 K/uL   Eosinophils Relative 1 %   Eosinophils Absolute 0.1 0.0 - 0.7 K/uL   Basophils Relative 0 %   Basophils Absolute 0.0 0.0 - 0.1 K/uL  Ethanol  Result Value Ref Range   Alcohol, Ethyl (B) 418 (HH) <5 mg/dL  Acetaminophen level  Result Value Ref Range   Acetaminophen (Tylenol), Serum <10 (L) 10 - 30 ug/mL  Salicylate level  Result Value Ref Range   Salicylate Lvl <4.0 2.8 - 30.0 mg/dL  Blood gas, arterial  Result Value Ref Range   FIO2 0.60    O2 Content 60.0 L/min   Delivery systems VENTILATOR    Mode PRESSURE REGULATED VOLUME CONTROL    VT 580 mL   LHR 15.0 resp/min   Peep/cpap 5.0 cm H20   pH, Arterial 7.408 7.350 - 7.450   pCO2 arterial 33.5 (L) 35.0 - 45.0 mmHg   pO2, Arterial 93.3 80.0 - 100.0 mmHg   Bicarbonate 22.2 20.0 - 24.0 mEq/L   TCO2  19.3 0 - 100 mmol/L   Acid-Base Excess 3.2 (H) 0.0 - 2.0 mmol/L   O2 Saturation 96.2 %   Collection site RIGHT RADIAL    Drawn by [id number]    Sample type ARTERIAL DRAW    Allens test (pass/fail) PASS PASS  I-Stat Chem 8, ED  Result Value Ref Range   Sodium 143 135 - 145 mmol/L   Potassium 3.7 3.5 - 5.1 mmol/L   Chloride 105 101 - 111 mmol/L   BUN 12 6 - 20 mg/dL   Creatinine, Ser 2.13 0.61 - 1.24 mg/dL   Glucose, Bld 086 (H) 65 - 99 mg/dL   Calcium, Ion 5.78 (L) 1.12 - 1.23 mmol/L   TCO2 21 0 - 100 mmol/L   Hemoglobin 15.6 13.0 - 17.0 g/dL   HCT 46.9 62.9 - 52.8 %  I-Stat Troponin, ED (not at [facility])  Result Value Ref Range   Troponin i, poc 0.00 0.00 - 0.08 ng/mL   Comment 3          I-Stat CG4 Lactic Acid, ED  (not at  [facility])  Result Value Ref Range   Lactic Acid, Venous 1.88 0.5 - 2.0 mmol/L     Imaging Review Ct Head Wo Contrast  02/18/2016  CLINICAL DATA:  Unresponsive.  Intubated. EXAM: CT HEAD WITHOUT CONTRAST TECHNIQUE: Contiguous axial images were obtained from the base of the skull through the vertex without intravenous contrast. COMPARISON:  Head CT 10/22/2010 FINDINGS: No intracranial hemorrhage, mass effect, or midline shift. No hydrocephalus. The basilar cisterns are patent. No evidence of territorial infarct. No intracranial fluid collection. Calvarium is intact. Included paranasal sinuses and mastoid air cells are well aerated. IMPRESSION: No acute intracranial abnormality. Electronically Signed  By: Rubye Oaks M.D.   On: 02/18/2016 23:19   Dg Chest Portable 1 View  02/18/2016  CLINICAL DATA:  Endotracheal tube placement.  Initial encounter. EXAM: PORTABLE CHEST 1 VIEW COMPARISON:  Chest radiograph performed 12/22/2014 FINDINGS: The patient's endotracheal tube is seen ending 4 cm above the carina. The lungs are hypoexpanded. Vascular congestion and vascular crowding are noted. Increased interstitial markings may reflect mild interstitial edema. No pleural effusion  or pneumothorax is seen. The cardiomediastinal silhouette is normal in size. No acute osseous abnormalities are identified. IMPRESSION: 1. Endotracheal tube seen ending 4 cm above the carina. 2. Lungs hypoexpanded. Vascular congestion noted. Increased interstitial markings may reflect mild interstitial edema. Electronically Signed   By: Roanna Raider M.D.   On: 02/18/2016 21:35   I have personally reviewed and evaluated these images and lab results as part of my medical decision-making.   EKG Interpretation   Date/Time:  Wednesday Feb 18 2016 21:09:54 EDT Ventricular Rate:  113 PR Interval:  137 QRS Duration: 87 QT Interval:  318 QTC Calculation: 436 R Axis:   71 Text Interpretation:  Sinus tachycardia Ventricular premature complex  Aberrant complex LAE, consider biatrial enlargement Minimal ST depression,  inferior leads ST elev, probable normal early repol pattern Confirmed by  Shirley Decamp  MD, Shana Younge [phone]) on 02/18/2016 9:16:23 PM       [email or web address] CRITICAL CARE Performed by: Vanetta Mulders Total critical care time: 60 minutes Critical care time was exclusive of separately billable procedures and treating other patients. Critical care was necessary to treat or prevent imminent or life-threatening deterioration. Critical care was time spent personally by me on the following activities: development of treatment plan with patient and/or surrogate as well as nursing, discussions with consultants, evaluation of patient's response to treatment, examination of patient, obtaining history from patient or surrogate, ordering and performing treatments and interventions, ordering and review of laboratory studies, ordering and review of radiographic studies, pulse oximetry and re-evaluation of patient's condition.     MDM   Final diagnoses:  Alcohol intoxication, with unspecified complication (HCC)  Glasgow coma scale total score 3-8, at arrival to emergency department [facility])   Patient  arrived unresponsive Glasgow coma scale of 4 only a little bit of head movement. EMS stated the date given Narcan 2 with brief improvement however we did not note that here. Blood sugar was fine. Patient vomited shortly after arrival. Patient was prepped for innervation and patient was intubated without any difficulties. Patient then dropped pressures for a brief period of time with systolic pressures going down to 83 some concerns for sepsis was raised. Family was concerned about massive alcohol intake. Patient started on sepsis protocol as total fluids to be 3 L. With fluid resuscitation patient's systolic blood pressures improved.  Ancillary workup really came down to just significant alcohol intoxication with alcohol level in the low 400s. Chest x-ray negative head CT negative for any acute event. Patient with a mild lactic acidosis but that's probably related to the significant alcohol levels. It was 1.88. Patient not febrile here. Following intubation he was much more stable. Urine drug screen was negative. No significant electrolyte or liver function test abnormalities.  No ICU beds here available at [facility] the patient will be admitted to the [facility] at [facility]. Discussed with critical care there and they will accept the patient patient will be transferred via [facility].   I personally performed the services described in this documentation, which was scribed in my presence. The recorded information has  been reviewed and is accurate.      Vanetta Mulders, MD 02/19/16 [phone]

## 2016-02-19 DIAGNOSIS — F10929 Alcohol use, unspecified with intoxication, unspecified: Secondary | ICD-10-CM | POA: Diagnosis present

## 2016-02-19 DIAGNOSIS — Z79899 Other long term (current) drug therapy: Secondary | ICD-10-CM | POA: Diagnosis not present

## 2016-02-19 DIAGNOSIS — Y908 Blood alcohol level of 240 mg/100 ml or more: Secondary | ICD-10-CM | POA: Diagnosis not present

## 2016-02-19 DIAGNOSIS — F10120 Alcohol abuse with intoxication, uncomplicated: Secondary | ICD-10-CM

## 2016-02-19 DIAGNOSIS — E861 Hypovolemia: Secondary | ICD-10-CM | POA: Diagnosis not present

## 2016-02-19 DIAGNOSIS — Z888 Allergy status to other drugs, medicaments and biological substances status: Secondary | ICD-10-CM | POA: Diagnosis not present

## 2016-02-19 DIAGNOSIS — I10 Essential (primary) hypertension: Secondary | ICD-10-CM | POA: Diagnosis not present

## 2016-02-19 DIAGNOSIS — F17290 Nicotine dependence, other tobacco product, uncomplicated: Secondary | ICD-10-CM | POA: Diagnosis not present

## 2016-02-19 DIAGNOSIS — I959 Hypotension, unspecified: Secondary | ICD-10-CM | POA: Diagnosis not present

## 2016-02-19 DIAGNOSIS — F10129 Alcohol abuse with intoxication, unspecified: Secondary | ICD-10-CM | POA: Diagnosis present

## 2016-02-19 DIAGNOSIS — F17299 Nicotine dependence, other tobacco product, with unspecified nicotine-induced disorders: Secondary | ICD-10-CM | POA: Diagnosis not present

## 2016-02-19 DIAGNOSIS — F10121 Alcohol abuse with intoxication delirium: Secondary | ICD-10-CM | POA: Diagnosis not present

## 2016-02-19 LAB — GLUCOSE, CAPILLARY: Glucose-Capillary: 136 mg/dL — ABNORMAL HIGH (ref 65–99)

## 2016-02-19 LAB — MRSA PCR SCREENING: MRSA by PCR: NEGATIVE

## 2016-02-19 MED ORDER — PANTOPRAZOLE SODIUM 40 MG IV SOLR
40.0000 mg | Freq: Every day | INTRAVENOUS | Status: DC
  Administered 2016-02-19: 40 mg via INTRAVENOUS
  Filled 2016-02-19: qty 40

## 2016-02-19 MED ORDER — DEXTROSE-NACL 5-0.9 % IV SOLN
INTRAVENOUS | Status: DC
  Administered 2016-02-19: 03:00:00 via INTRAVENOUS

## 2016-02-19 MED ORDER — SODIUM CHLORIDE 0.9 % IV SOLN: INTRAVENOUS | Status: DC

## 2016-02-19 MED ORDER — PANTOPRAZOLE SODIUM 40 MG PO TBEC: 40.0000 mg | DELAYED_RELEASE_TABLET | Freq: Every day | ORAL | Status: DC

## 2016-02-19 MED ORDER — THIAMINE HCL 100 MG/ML IJ SOLN
100.0000 mg | Freq: Every day | INTRAMUSCULAR | Status: DC
  Administered 2016-02-19: 100 mg via INTRAVENOUS
  Filled 2016-02-19: qty 2

## 2016-02-19 MED ORDER — MIDAZOLAM HCL 2 MG/2ML IJ SOLN: 2.0000 mg | INTRAMUSCULAR | Status: DC | PRN

## 2016-02-19 MED ORDER — FOLIC ACID 5 MG/ML IJ SOLN
1.0000 mg | Freq: Every day | INTRAMUSCULAR | Status: DC
  Administered 2016-02-19: 1 mg via INTRAVENOUS
  Filled 2016-02-19: qty 0.2

## 2016-02-19 MED ORDER — FENTANYL CITRATE (PF) 100 MCG/2ML IJ SOLN: 100.0000 ug | INTRAMUSCULAR | Status: DC | PRN

## 2016-02-19 NOTE — Discharge Summary (Signed)
Physician Discharge Summary       Patient ID: Adam Vaughan MRN: [id number] DOB/AGE: 1971/01/25 45 y.o.  Admit date: 02/18/2016 Discharge date: 02/19/2016  Discharge Diagnoses:  Active Problems:   Alcohol intoxication (HCC)   History of Present Illness/Hospital Course:  45 yo male was found unresponsive at home. No significant improvement after getting narcan by EMS. He was taken to [facility] ED where he was intubated for airway protection. He has hx of ETOH. His alcohol level was 418. CT head negative. There was some mention of sepsis so he was transferred to [facility] for ICU admission. This was not the case. Sedation was held and he was allowed to wake up. He was extubated 6/1 AM and was awake and following commands. He was grossly nonfocal, comfortable, and had no complaints at that time. He was deemed ready for discharge.    Discharge Plan by active problems   Acute encephalopathy 2nd to alcohol intoxication. - Abstain from alcohol abuse/intoxication - Rehab/AA resources offered  HTN. - resume outpatient amlodipine 6/2   Significant Hospital tests/ studies  Consults  5/31 CT head >> negative Blood urine cultures pending  Discharge Exam: BP 126/74 mmHg  Pulse 109  Temp(Src) 98.7 F (37.1 C) (Oral)  Resp 18  Ht  (1.778 m)  SpO2 94%  Neuro:  Alert, oriented, non-focal HEENT:  [place]/AT, No JVD noted, PERRL Cardiovascular: Regular, borderline tachy, no MRG Lungs:  clear Abdomen:  Soft, non-distended Musculoskeletal:  No acute deformity Skin:  Intact, MMM  Labs at discharge Lab Results  Component Value Date   CREATININE 1.10 02/18/2016   BUN 12 02/18/2016   NA 143 02/18/2016   K 3.7 02/18/2016   CL 105 02/18/2016   CO2 21* 02/18/2016   Lab Results  Component Value Date   WBC 6.8 02/18/2016   HGB 15.6 02/18/2016   HCT 46.0 02/18/2016   MCV 81.5 02/18/2016   PLT 259 02/18/2016   Lab Results  Component Value Date   ALT 35 02/18/2016   AST 28  02/18/2016   ALKPHOS 55 02/18/2016   BILITOT 0.4 02/18/2016   Lab Results  Component Value Date   INR 0.93 12/09/2009    Current radiology studies Ct Head Wo Contrast  02/18/2016  CLINICAL DATA:  Unresponsive.  Intubated. EXAM: CT HEAD WITHOUT CONTRAST TECHNIQUE: Contiguous axial images were obtained from the base of the skull through the vertex without intravenous contrast. COMPARISON:  Head CT 10/22/2010 FINDINGS: No intracranial hemorrhage, mass effect, or midline shift. No hydrocephalus. The basilar cisterns are patent. No evidence of territorial infarct. No intracranial fluid collection. Calvarium is intact. Included paranasal sinuses and mastoid air cells are well aerated. IMPRESSION: No acute intracranial abnormality. Electronically Signed   By: Rubye Oaks M.D.   On: 02/18/2016 23:19   Dg Chest Portable 1 View  02/18/2016  CLINICAL DATA:  Endotracheal tube placement.  Initial encounter. EXAM: PORTABLE CHEST 1 VIEW COMPARISON:  Chest radiograph performed 12/22/2014 FINDINGS: The patient's endotracheal tube is seen ending 4 cm above the carina. The lungs are hypoexpanded. Vascular congestion and vascular crowding are noted. Increased interstitial markings may reflect mild interstitial edema. No pleural effusion or pneumothorax is seen. The cardiomediastinal silhouette is normal in size. No acute osseous abnormalities are identified. IMPRESSION: 1. Endotracheal tube seen ending 4 cm above the carina. 2. Lungs hypoexpanded. Vascular congestion noted. Increased interstitial markings may reflect mild interstitial edema. Electronically Signed   By: Roanna Raider M.D.   On: 02/18/2016  21:35    Disposition:  01-Home or Self Care      Discharge Instructions    Diet - low sodium heart healthy    Complete by:  As directed      Increase activity slowly    Complete by:  As directed             Medication List    TAKE these medications        amLODipine 5 MG tablet  Commonly known  as:  NORVASC  Take 1 tablet by mouth daily.     traZODone 50 MG tablet  Commonly known as:  DESYREL  Take 1 tablet by mouth at bedtime.         Discharged Condition: good  Greater than 35 minutes of time have been dedicated to discharge assessment, planning and discharge instructions.   Signed: Joneen RoachPaul Hoffman, AGACNP-BC [facility]/Critical Care Pager [phone] or [phone]  02/19/2016 3:39 PM

## 2016-02-19 NOTE — Progress Notes (Signed)
Dc instructions given to pt at this time.  Pt verbalized understanding of all instructions.  No s/s of any acute distress.  IVs dc'd.

## 2016-02-19 NOTE — Care Management Note (Signed)
Case Management Note  Patient Details  Name: Adam Vaughan MRN: [id number] Date of Birth: 02/14/1971  Subjective/Objective:    Pt admitted with alcohol                 Action/Plan:  PTA - independent from home.  NO PCP - CM provided [facility] Form to pt.  NO other CM needs identified   Expected Discharge Date:                  Expected Discharge Plan:  Home/Self Care  In-House Referral:  PCP / [facility]  Discharge planning Services  CM Consult  Post Acute Care Choice:    Choice offered to:     DME Arranged:    DME Agency:     HH Arranged:    HH Agency:     Status of Service:  Completed, signed off  Medicare Important Message Given:    Date Medicare IM Given:    Medicare IM give by:    Date Additional Medicare IM Given:    Additional Medicare Important Message give by:     If discussed at Long Length of Stay Meetings, dates discussed:    Additional Comments:  Cherylann ParrClaxton, Deforest Maiden S, RN 02/19/2016, 3:55 PM

## 2016-02-19 NOTE — Discharge Instructions (Signed)
° °  [facility] 1 [phone]

## 2016-02-19 NOTE — Procedures (Signed)
Extubation Procedure Note  Patient Details:   Name: Adam Vaughan DOB: 06/05/1971 MRN: [id number]   Airway Documentation: Pt had an audible cuff leak prior to extubation.  Placed on 4lpm [place] sat 100%. RR 19, HR 82.  Will continue to monitor.    Evaluation  O2 sats: stable throughout Complications: No apparent complications Patient did tolerate procedure well. Bilateral Breath Sounds: Clear   Yes  Richmond CampbellHall, Deidre Carino Lynn 02/19/2016, 9:33 AM

## 2016-02-19 NOTE — H&P (Signed)
PULMONARY / CRITICAL CARE MEDICINE   Name: Adam FreerJohnny Watkin MRN: [id number] DOB: 05/16/1971    ADMISSION DATE:  02/18/2016  REFERRING MD:  Dr. Deretha EmoryZackowski  CHIEF COMPLAINT:  Altered mental status  HISTORY OF PRESENT ILLNESS:   Hx from chart.  45 yo male was found unresponsive at home.  No significant improvement after getting narcan by EMS >> he was able to say "F-you" to his significant other and then passed out again.  He was intubated for airway protection.  He has hx of ETOH.  His alcohol level was 418.  CT head negative.  [facility]  He need restraints and versed gtt to keep him calm.  PAST MEDICAL HISTORY :  He  has a past medical history of Hypertension.  PAST SURGICAL HISTORY: He  has no past surgical history on file.  Allergies  Allergen Reactions  . Dairy Aid [Lactase] Diarrhea and Nausea And Vomiting    No current facility-administered medications on file prior to encounter.   Current Outpatient Prescriptions on File Prior to Encounter  Medication Sig  . amLODipine (NORVASC) 5 MG tablet Take 1 tablet by mouth daily.  . traZODone (DESYREL) 50 MG tablet Take 1 tablet by mouth at bedtime.    FAMILY HISTORY:  Unable to obtain.  SOCIAL HISTORY: He  reports that he has never smoked. His smokeless tobacco use includes Snuff. He reports that he does not drink alcohol or use illicit drugs.  REVIEW OF SYSTEMS:   Unable to obtain.  SUBJECTIVE:   VITAL SIGNS: BP 113/89 mmHg  Pulse 99  Temp(Src) 97.7 F (36.5 C)  Resp 15  Ht 5\' 10"  (1.778 m)  SpO2 99%  HEMODYNAMICS:    VENTILATOR SETTINGS: Vent Mode:  [-] PRVC FiO2 (%):  [40 %-70 %] 40 % Set Rate:  [15 bmp] 15 bmp Vt Set:  [580 mL] 580 mL PEEP:  [5 cmH20] 5 cmH20 Plateau Pressure:  [15 cmH20-20 cmH20] 15 cmH20  INTAKE / OUTPUT:    PHYSICAL EXAMINATION: General:  Sedated Neuro:  RASS -4 HEENT:  Pupils reactive, ETT in place Cardiovascular:  Regular, no murmur Lungs:  No  wheeze Abdomen:  Soft, mild distention Musculoskeletal:  No edema Skin:  No rashes  LABS:  BMET  Recent Labs Lab 02/18/16 2132 02/18/16 2139  NA 138 143  K 3.6 3.7  CL 107 105  CO2 21*  --   BUN 13 12  CREATININE 0.71 1.10  GLUCOSE 131* 131*    Electrolytes  Recent Labs Lab 02/18/16 2132  CALCIUM 7.8*    CBC  Recent Labs Lab 02/18/16 2132 02/18/16 2139  WBC 6.8  --   HGB 14.3 15.6  HCT 42.6 46.0  PLT 259  --     Coag's No results for input(s): APTT, INR in the last 168 hours.  Sepsis Markers  Recent Labs Lab 02/18/16 2309  LATICACIDVEN 1.88    ABG  Recent Labs Lab 02/18/16 2252  PHART 7.408  PCO2ART 33.5*  PO2ART 93.3    Liver Enzymes  Recent Labs Lab 02/18/16 2132  AST 28  ALT 35  ALKPHOS 55  BILITOT 0.4  ALBUMIN 4.0    Cardiac Enzymes No results for input(s): TROPONINI, PROBNP in the last 168 hours.  Glucose No results for input(s): GLUCAP in the last 168 hours.  Imaging Ct Head Wo Contrast  02/18/2016  CLINICAL DATA:  Unresponsive.  Intubated. EXAM: CT HEAD WITHOUT CONTRAST TECHNIQUE: Contiguous axial images were obtained from  the base of the skull through the vertex without intravenous contrast. COMPARISON:  Head CT 10/22/2010 FINDINGS: No intracranial hemorrhage, mass effect, or midline shift. No hydrocephalus. The basilar cisterns are patent. No evidence of territorial infarct. No intracranial fluid collection. Calvarium is intact. Included paranasal sinuses and mastoid air cells are well aerated. IMPRESSION: No acute intracranial abnormality. Electronically Signed   By: Rubye Oaks M.D.   On: 02/18/2016 23:19   Dg Chest Portable 1 View  02/18/2016  CLINICAL DATA:  Endotracheal tube placement.  Initial encounter. EXAM: PORTABLE CHEST 1 VIEW COMPARISON:  Chest radiograph performed 12/22/2014 FINDINGS: The patient's endotracheal tube is seen ending 4 cm above the carina. The lungs are hypoexpanded. Vascular congestion and  vascular crowding are noted. Increased interstitial markings may reflect mild interstitial edema. No pleural effusion or pneumothorax is seen. The cardiomediastinal silhouette is normal in size. No acute osseous abnormalities are identified. IMPRESSION: 1. Endotracheal tube seen ending 4 cm above the carina. 2. Lungs hypoexpanded. Vascular congestion noted. Increased interstitial markings may reflect mild interstitial edema. Electronically Signed   By: Roanna Raider M.D.   On: 02/18/2016 21:35     STUDIES:  5/31 CT head >> negative  CULTURES: 5/31 Blood >>  5/31 urine >>   ANTIBIOTICS: 5/31 Vancomycin >> 6/01 5/31 Zosyn >> 6/01  SIGNIFICANT EVENTS: 5/31 Present to [facility] ER 6/01 Transferred to [facility]  LINES/TUBES: 5/31 ETT >>  DISCUSSION: 45 yo male with know hx of ETOH abuse presented to [facility] with altered mental status with ETOH level 418.  He was intubated for airway protection, but the required versed gtt and restraints.  He developed hypotension while on sedation, and then code sepsis activated > no evidence for infection.  ASSESSMENT / PLAN:  Acute encephalopathy 2nd to alcohol intoxication. - d/c sedation and allow him to wake up - thiamine, folic acid, dextrose in IV fluid  Compromised airway 2nd to alcohol intoxication. - proceed with extubation trial once he is more alert  Hypotension most likely related to hypovolemia and sedation >> no evidence for infection/sepsis. - continue IV fluid - d/c Abx, lactic acid f/u testing  Hx of HTN. - hold outpt amlodipine   Summary: He will need further question once he is awake about whether he intentional tried to do himself harm.  If this is not the case, then he would probably be ready for discharge soon after extubation.  Coralyn Helling, MD [facility] Pulmonary/Critical Care 02/19/2016, 3:12 AM Pager:  [phone] After 3pm call: [phone]

## 2016-02-19 NOTE — Progress Notes (Signed)
PULMONARY / CRITICAL CARE MEDICINE   Name: Adam Vaughan MRN: [id number] DOB: 11/28/1970    ADMISSION DATE:  02/18/2016  REFERRING MD:  Dr. Deretha EmoryZackowski  CHIEF COMPLAINT:  Altered mental status  HISTORY OF PRESENT ILLNESS:   Hx from chart.  45 yo male was found unresponsive at home.  No significant improvement after getting narcan by EMS >> he was able to say "F-you" to his significant other and then passed out again.  He was intubated for airway protection.  He has hx of ETOH.  His alcohol level was 418.  CT head negative.  [facility]  He need restraints and versed gtt to keep him calm.    SUBJECTIVE:  Remains ins on vent  VITAL SIGNS: BP 104/69 mmHg  Pulse 91  Temp(Src) 97.9 F (36.6 C) (Axillary)  Resp 14  Ht 5\' 10"  (1.778 m)  SpO2 99%  HEMODYNAMICS:    VENTILATOR SETTINGS: Vent Mode:  [-] PRVC FiO2 (%):  [40 %-70 %] 40 % Set Rate:  [14 bmp-15 bmp] 14 bmp Vt Set:  [580 mL] 580 mL PEEP:  [5 cmH20] 5 cmH20 Plateau Pressure:  [15 cmH20-20 cmH20] 18 cmH20  INTAKE / OUTPUT: I/O last 3 completed shifts: In: 226 [I.V.:226] Out: 825 [Urine:525; Emesis/NG output:300]  PHYSICAL EXAMINATION: General:  Lethargic despite sedation being off. Periods of apnea mixed with agitation Neuro:  RASS -1 with intermittent agitation HEENT:  Pupils reactive, ETT in place Cardiovascular:  Regular, no murmur Lungs:  No wheeze Abdomen:  Soft, mild distention Musculoskeletal:  No edema Skin:  No rashes  LABS:  BMET  Recent Labs Lab 02/18/16 2132 02/18/16 2139  NA 138 143  K 3.6 3.7  CL 107 105  CO2 21*  --   BUN 13 12  CREATININE 0.71 1.10  GLUCOSE 131* 131*    Electrolytes  Recent Labs Lab 02/18/16 2132  CALCIUM 7.8*    CBC  Recent Labs Lab 02/18/16 2132 02/18/16 2139  WBC 6.8  --   HGB 14.3 15.6  HCT 42.6 46.0  PLT 259  --     Coag's No results for input(s): APTT, INR in the last 168 hours.  Sepsis Markers  Recent Labs Lab  02/18/16 2309  LATICACIDVEN 1.88    ABG  Recent Labs Lab 02/18/16 2252  PHART 7.408  PCO2ART 33.5*  PO2ART 93.3    Liver Enzymes  Recent Labs Lab 02/18/16 2132  AST 28  ALT 35  ALKPHOS 55  BILITOT 0.4  ALBUMIN 4.0    Cardiac Enzymes No results for input(s): TROPONINI, PROBNP in the last 168 hours.  Glucose  Recent Labs Lab 02/19/16 0249  GLUCAP 136*    Imaging Ct Head Wo Contrast  02/18/2016  CLINICAL DATA:  Unresponsive.  Intubated. EXAM: CT HEAD WITHOUT CONTRAST TECHNIQUE: Contiguous axial images were obtained from the base of the skull through the vertex without intravenous contrast. COMPARISON:  Head CT 10/22/2010 FINDINGS: No intracranial hemorrhage, mass effect, or midline shift. No hydrocephalus. The basilar cisterns are patent. No evidence of territorial infarct. No intracranial fluid collection. Calvarium is intact. Included paranasal sinuses and mastoid air cells are well aerated. IMPRESSION: No acute intracranial abnormality. Electronically Signed   By: Rubye OaksMelanie  Ehinger M.D.   On: 02/18/2016 23:19   Dg Chest Portable 1 View  02/18/2016  CLINICAL DATA:  Endotracheal tube placement.  Initial encounter. EXAM: PORTABLE CHEST 1 VIEW COMPARISON:  Chest radiograph performed 12/22/2014 FINDINGS: The patient's endotracheal tube is  seen ending 4 cm above the carina. The lungs are hypoexpanded. Vascular congestion and vascular crowding are noted. Increased interstitial markings may reflect mild interstitial edema. No pleural effusion or pneumothorax is seen. The cardiomediastinal silhouette is normal in size. No acute osseous abnormalities are identified. IMPRESSION: 1. Endotracheal tube seen ending 4 cm above the carina. 2. Lungs hypoexpanded. Vascular congestion noted. Increased interstitial markings may reflect mild interstitial edema. Electronically Signed   By: Roanna Raider M.D.   On: 02/18/2016 21:35     STUDIES:  5/31 CT head >> negative  CULTURES: 5/31  Blood >>  5/31 urine >>   ANTIBIOTICS: 5/31 Vancomycin >> 6/01 5/31 Zosyn >> 6/01  SIGNIFICANT EVENTS: 5/31 Present to [facility] ER 6/01 Transferred to [facility]  LINES/TUBES: 5/31 ETT >>  DISCUSSION: 45 yo male with know hx of ETOH abuse presented to [facility] with altered mental status with ETOH level 418.  He was intubated for airway protection, but the required versed gtt and restraints.  He developed hypotension while on sedation, and then code sepsis activated > no evidence for infection.  ASSESSMENT / PLAN:  Acute encephalopathy 2nd to alcohol intoxication. - d/c sedation and allow him to wake up - thiamine, folic acid, dextrose in IV fluid  Compromised airway 2nd to alcohol intoxication. - proceed with extubation trial once he is more alert. Currently not following commands and has periods of apnea.  Hypotension most likely related to hypovolemia and sedation >> no evidence for infection/sepsis. - continue IV fluid - d/c Abx, lactic acid f/u testing  Hx of HTN. - hold outpt amlodipine   Summary: He will need further question once he is awake about whether he intentional tried to do himself harm.  If this is not the case, then he would probably be ready for discharge soon after extubation.  Brett Canales Minor ACNP [facility] P[facility] Pager [phone] till 3 pm If no answer page [phone] 02/19/2016, 8:22 AM  PCCM Attending Note: Patient seen and examined with nurse practitioner please refer to his progress note which I reviewed. At the time of my rebounding on the patient this morning he is now awake and following commands. He appears grossly nonfocal. He is comfortable at this time. He is able to pull greater than 1000 mL on pressure support 0/5. He is no longer having periods of apnea. Sedation remains on hold. Checking for a cuff leak at this present plan to extubate patient this morning.  Additional critical care time 24 minutes this morning.  Donna Christen Jamison Neighbor, M.D. [facility] Pulmonary &  Critical Care Pager:  [phone] After 3pm or if no response, call [phone] 9:12 AM 02/19/2016

## 2016-02-20 LAB — URINE CULTURE: Culture: NO GROWTH

## 2016-02-23 LAB — CULTURE, BLOOD (ROUTINE X 2)
CULTURE: NO GROWTH
CULTURE: NO GROWTH

## 2022-04-12 DIAGNOSIS — M79652 Pain in left thigh: Secondary | ICD-10-CM | POA: Diagnosis not present

## 2022-04-20 DIAGNOSIS — E785 Hyperlipidemia, unspecified: Secondary | ICD-10-CM | POA: Diagnosis not present

## 2022-04-20 DIAGNOSIS — I10 Essential (primary) hypertension: Secondary | ICD-10-CM | POA: Diagnosis not present

## 2022-04-20 DIAGNOSIS — Z1339 Encounter for screening examination for other mental health and behavioral disorders: Secondary | ICD-10-CM | POA: Diagnosis not present

## 2022-09-16 DIAGNOSIS — Z125 Encounter for screening for malignant neoplasm of prostate: Secondary | ICD-10-CM | POA: Diagnosis not present

## 2022-09-16 DIAGNOSIS — I10 Essential (primary) hypertension: Secondary | ICD-10-CM | POA: Diagnosis not present

## 2023-09-22 DIAGNOSIS — R7303 Prediabetes: Secondary | ICD-10-CM | POA: Diagnosis not present

## 2023-09-22 DIAGNOSIS — E785 Hyperlipidemia, unspecified: Secondary | ICD-10-CM | POA: Diagnosis not present

## 2023-09-22 DIAGNOSIS — Z125 Encounter for screening for malignant neoplasm of prostate: Secondary | ICD-10-CM | POA: Diagnosis not present

## 2023-10-14 DIAGNOSIS — E1169 Type 2 diabetes mellitus with other specified complication: Secondary | ICD-10-CM | POA: Diagnosis not present

## 2023-10-14 DIAGNOSIS — Z1331 Encounter for screening for depression: Secondary | ICD-10-CM | POA: Diagnosis not present

## 2023-10-14 DIAGNOSIS — Z0000 Encounter for general adult medical examination without abnormal findings: Secondary | ICD-10-CM | POA: Diagnosis not present

## 2023-10-14 DIAGNOSIS — Z1339 Encounter for screening examination for other mental health and behavioral disorders: Secondary | ICD-10-CM | POA: Diagnosis not present

## 2023-10-14 DIAGNOSIS — R82998 Other abnormal findings in urine: Secondary | ICD-10-CM | POA: Diagnosis not present

## 2023-11-02 ENCOUNTER — Ambulatory Visit: Payer: BC Managed Care – PPO | Admitting: Podiatry

## 2023-11-13 ENCOUNTER — Emergency Department (HOSPITAL_BASED_OUTPATIENT_CLINIC_OR_DEPARTMENT_OTHER): Payer: BC Managed Care – PPO

## 2023-11-13 ENCOUNTER — Emergency Department (HOSPITAL_BASED_OUTPATIENT_CLINIC_OR_DEPARTMENT_OTHER)
Admission: EM | Admit: 2023-11-13 | Discharge: 2023-11-13 | Disposition: A | Discharge status: Home / Self Care | Payer: BC Managed Care – PPO

## 2023-11-13 ENCOUNTER — Other Ambulatory Visit: Payer: Self-pay

## 2023-11-13 ENCOUNTER — Emergency Department (HOSPITAL_BASED_OUTPATIENT_CLINIC_OR_DEPARTMENT_OTHER): Payer: BC Managed Care – PPO | Admitting: Radiology

## 2023-11-13 ENCOUNTER — Encounter (HOSPITAL_BASED_OUTPATIENT_CLINIC_OR_DEPARTMENT_OTHER): Payer: Self-pay | Admitting: Emergency Medicine

## 2023-11-13 DIAGNOSIS — R079 Chest pain, unspecified: Secondary | ICD-10-CM | POA: Diagnosis not present

## 2023-11-13 DIAGNOSIS — R0789 Other chest pain: Secondary | ICD-10-CM | POA: Insufficient documentation

## 2023-11-13 DIAGNOSIS — K6389 Other specified diseases of intestine: Secondary | ICD-10-CM | POA: Diagnosis not present

## 2023-11-13 DIAGNOSIS — R911 Solitary pulmonary nodule: Secondary | ICD-10-CM | POA: Diagnosis not present

## 2023-11-13 LAB — COMPREHENSIVE METABOLIC PANEL
ALT: 26 U/L (ref 0–44)
AST: 21 U/L (ref 15–41)
Albumin: 4.6 g/dL (ref 3.5–5.0)
Alkaline Phosphatase: 52 U/L (ref 38–126)
Anion gap: 11 (ref 5–15)
BUN: 13 mg/dL (ref 6–20)
CO2: 26 mmol/L (ref 22–32)
Calcium: 9.5 mg/dL (ref 8.9–10.3)
Chloride: 97 mmol/L — ABNORMAL LOW (ref 98–111)
Creatinine, Ser: 1.13 mg/dL (ref 0.61–1.24)
GFR, Estimated: >60 mL/min (ref >60)
Glucose, Bld: 175 mg/dL — ABNORMAL HIGH (ref 70–99)
Potassium: 3.6 mmol/L (ref 3.5–5.1)
Sodium: 134 mmol/L — ABNORMAL LOW (ref 135–145)
Total Bilirubin: 1.8 mg/dL — ABNORMAL HIGH (ref 0.0–1.2)
Total Protein: 8.1 g/dL (ref 6.5–8.1)

## 2023-11-13 LAB — RESP PANEL BY RT-PCR (RSV, FLU A&B, COVID)  RVPGX2
Influenza A by PCR: NEGATIVE
Influenza B by PCR: NEGATIVE
Resp Syncytial Virus by PCR: NEGATIVE
SARS Coronavirus 2 by RT PCR: NEGATIVE

## 2023-11-13 LAB — CBC
HCT: 42.7 % (ref 39.0–52.0)
Hemoglobin: 14.7 g/dL (ref 13.0–17.0)
MCH: 27.7 pg (ref 26.0–34.0)
MCHC: 34.4 g/dL (ref 30.0–36.0)
MCV: 80.6 fL (ref 80.0–100.0)
Platelets: 296 10*3/uL (ref 150–400)
RBC: 5.3 MIL/uL (ref 4.22–5.81)
RDW: 12.4 % (ref 11.5–15.5)
WBC: 5.5 10*3/uL (ref 4.0–10.5)
nRBC: 0 % (ref 0.0–0.2)

## 2023-11-13 LAB — TROPONIN I (HIGH SENSITIVITY): Troponin I (High Sensitivity): 4 ng/L (ref <18)

## 2023-11-13 LAB — D-DIMER, QUANTITATIVE: D-Dimer, Quant: 1.64 ug{FEU}/mL — ABNORMAL HIGH (ref 0.00–0.50)

## 2023-11-13 LAB — LIPASE, BLOOD: Lipase: 27 U/L (ref 11–51)

## 2023-11-13 MED ORDER — ONDANSETRON 4 MG PO TBDP: 4.0000 mg | ORAL_TABLET | Freq: Three times a day (TID) | ORAL | 0 refills | Status: AC | PRN

## 2023-11-13 MED ORDER — ALUM & MAG HYDROXIDE-SIMETH 200-200-20 MG/5ML PO SUSP
30.0000 mL | Freq: Once | ORAL | Status: AC
  Administered 2023-11-13: 30 mL via ORAL
  Filled 2023-11-13: qty 30

## 2023-11-13 MED ORDER — ONDANSETRON HCL 4 MG/2ML IJ SOLN
4.0000 mg | Freq: Once | INTRAMUSCULAR | Status: AC
  Administered 2023-11-13: 4 mg via INTRAVENOUS
  Filled 2023-11-13: qty 2

## 2023-11-13 MED ORDER — PANTOPRAZOLE SODIUM 20 MG PO TBEC: 20.0000 mg | DELAYED_RELEASE_TABLET | Freq: Every day | ORAL | 0 refills | Status: AC

## 2023-11-13 MED ORDER — IOHEXOL 350 MG/ML SOLN
75.0000 mL | Freq: Once | INTRAVENOUS | Status: AC | PRN
  Administered 2023-11-13: 75 mL via INTRAVENOUS

## 2023-11-13 NOTE — ED Provider Notes (Signed)
 [place] EMERGENCY DEPARTMENT AT [facility] Provider Note   CSN: [id number] Arrival date & time: 11/13/23  0757     History  Chief Complaint  Patient presents with   Chest Pain    Adam Vaughan is a 53 y.o. male.  This is a 53 year old male presenting emergency department for chest pain and shortness of breath.  Symptoms started Thursday after traveling from [place]. Feels like indigsestion. CP burning. Non-radiating. Notes some SOB as well. Also notes HA, congestion, rhinorrhea for same duration.    Chest Pain      Home Medications Prior to Admission medications   Medication Sig Start Date End Date Taking? Authorizing Provider  ondansetron (ZOFRAN-ODT) 4 MG disintegrating tablet Take 1 tablet (4 mg total) by mouth every 8 (eight) hours as needed for nausea or vomiting. 11/13/23  Yes Estanislado Pandy J, DO  pantoprazole (PROTONIX) 20 MG tablet Take 1 tablet (20 mg total) by mouth daily. 11/13/23  Yes Estanislado Pandy J, DO  amLODipine (NORVASC) 5 MG tablet Take 1 tablet by mouth daily. 02/13/15   [provider]  traZODone (DESYREL) 50 MG tablet Take 1 tablet by mouth at bedtime. 02/13/15   [provider]      Allergies    Dairy aid [tilactase]    Review of Systems   Review of Systems  Cardiovascular:  Positive for chest pain.    Physical Exam Updated Vital Signs BP 125/88 (BP Location: Left Arm)   Pulse 98   Temp 98.5 F (36.9 C) (Oral)   Resp 16   SpO2 100%  Physical Exam Vitals and nursing note reviewed.  Constitutional:      General: He is not in acute distress.    Appearance: He is not toxic-appearing.  HENT:     Head: Normocephalic.  Cardiovascular:     Rate and Rhythm: Normal rate and regular rhythm.     Pulses:          Radial pulses are 2+ on the right side and 2+ on the left side.     Heart sounds: Normal heart sounds.  Pulmonary:     Effort: Pulmonary effort is normal.     Breath sounds: Normal breath sounds. No wheezing,  rhonchi or rales.  Musculoskeletal:     Right lower leg: No edema.     Left lower leg: No edema.  Skin:    General: Skin is warm and dry.     Capillary Refill: Capillary refill takes less than 2 seconds.  Neurological:     General: No focal deficit present.     Mental Status: He is alert.  Psychiatric:        Mood and Affect: Mood normal.        Behavior: Behavior normal.     ED Results / Procedures / Treatments   Labs (all labs ordered are listed, but only abnormal results are displayed) Labs Reviewed  COMPREHENSIVE METABOLIC PANEL - Abnormal; Notable for the following components:      Result Value   Sodium 134 (*)    Chloride 97 (*)    Glucose, Bld 175 (*)    Total Bilirubin 1.8 (*)    All other components within normal limits  D-DIMER, QUANTITATIVE - Abnormal; Notable for the following components:   D-Dimer, Quant 1.64 (*)    All other components within normal limits  RESP PANEL BY RT-PCR (RSV, FLU A&B, COVID)  [id number]  CBC  LIPASE, BLOOD  TROPONIN I (HIGH SENSITIVITY)  EKG EKG Interpretation Date/Time:  Sunday November 13 2023 08:04:23 EST Ventricular Rate:  80 PR Interval:  118 QRS Duration:  89 QT Interval:  354 QTC Calculation: 409 R Axis:   42  Text Interpretation: Sinus arrhythmia Borderline short PR interval Probable left atrial enlargement Borderline repolarization abnormality Minimal ST elevation, anterior leads No significant change since last tracing Confirmed by Estanislado Pandy [phone]) on 11/13/2023 8:06:44 AM  Radiology CT Angio Chest PE W and/or Wo Contrast Result Date: 11/13/2023 CLINICAL DATA:  Pulmonary embolus suspected. EXAM: CT ANGIOGRAPHY CHEST WITH CONTRAST TECHNIQUE: Multidetector CT imaging of the chest was performed using the standard protocol during bolus administration of intravenous contrast. Multiplanar CT image reconstructions and MIPs were obtained to evaluate the vascular anatomy. RADIATION DOSE REDUCTION: This exam was performed  according to the departmental dose-optimization program which includes automated exposure control, adjustment of the mA and/or kV according to patient size and/or use of iterative reconstruction technique. CONTRAST:  75mL OMNIPAQUE IOHEXOL 350 MG/ML SOLN COMPARISON:  None Available. FINDINGS: Cardiovascular: No filling defects within the pulmonary arteries to suggest acute pulmonary embolism. Dilatation the main pulmonary artery to 37 mm (image 110/series 10); however, the main pulmonary arteries are normal caliber. No axillary or supraclavicular adenopathy. No mediastinal or hilar adenopathy. No pericardial fluid. Esophagus normal. Mediastinum/Nodes: No axillary or supraclavicular adenopathy. No mediastinal or hilar adenopathy. No pericardial fluid. Esophagus normal. Lungs/Pleura: Small RIGHT lower lobe pulmonary nodule measures 4 mm (image 91/series 6) Upper Abdomen: No acute abnormality. Musculoskeletal: No chest wall abnormality. No acute or significant osseous findings. Review of the MIP images confirms the above findings. IMPRESSION: 1. No acute pulmonary embolism. 2. Right solid pulmonary nodule measuring 4 mm. Per Fleischner Society Guidelines, no routine follow-up imaging is recommended. These guidelines do not apply to immunocompromised patients and patients with cancer. Follow up in patients with significant comorbidities as clinically warranted. For lung cancer screening, adhere to Lung-RADS guidelines. Reference: Radiology. 2017; 284(1):228-43. Electronically Signed   By: Genevive Bi M.D.   On: 11/13/2023 12:21   DG Chest 2 View Result Date: 11/13/2023 CLINICAL DATA:  Chest pain EXAM: CHEST - 2 VIEW COMPARISON:  02/18/2016 FINDINGS: The lungs are clear without focal pneumonia, edema, pneumothorax or pleural effusion. The cardiopericardial silhouette is within normal limits for size. No acute bony abnormality. Telemetry leads overlie the chest. Gaseous distention of bowel loops noted under the  left hemidiaphragm. IMPRESSION: No acute cardiopulmonary findings. Electronically Signed   By: Kennith Center M.D.   On: 11/13/2023 08:22    Procedures Procedures    Medications Ordered in ED Medications  alum & mag hydroxide-simeth (MAALOX/MYLANTA) 200-200-20 MG/5ML suspension 30 mL (30 mLs Oral Given 11/13/23 0826)  iohexol (OMNIPAQUE) 350 MG/ML injection 75 mL (75 mLs Intravenous Contrast Given 11/13/23 1128)  ondansetron (ZOFRAN) injection 4 mg (4 mg Intravenous Given 11/13/23 1139)    ED Course/ Medical Decision Making/ A&P Clinical Course as of 11/13/23 1501  Sun Nov 13, 2023  0844 DG Chest 2 View No pneumonia or pneumothorax on my independent review.  [TY]  0845 EKG 12-Lead On my independent interpretation; EKG appears to be normal sinus rhythm at a rate of 80 bpm.  Normal intervals.  No ST segment changes to indicate ischemia.  QTc 409. [TY]  [id number] Comprehensive metabolic panel(!) No significant metabolic derangements.  Hyperglycemic, but does not appear to be in DKA.  No elevation in kidney function to suggest AKI.  No transaminitis to suggest hepatobiliary disease.  Bilirubin with mild  elevation, but appears has a history of elevation and is lower than prior per chart review [TY]  0915 Lipase: 27 Pancreatitis unlikely [TY]  0915 Troponin I (High Sensitivity): 4 ACS unlikely given duration of symptoms for the past 2 days [TY]  1005 D-Dimer, Quant(!): 1.64 Will get CTA. Patient updated on results.  [TY]  1230 CT Angio Chest PE W and/or Wo Contrast IMPRESSION: 1. No acute pulmonary embolism. 2. Right solid pulmonary nodule measuring 4 mm. Per Fleischner Society Guidelines, no routine follow-up imaging is recommended. These guidelines do not apply to immunocompromised patients and patients with cancer. Follow up in patients with significant comorbidities as clinically warranted. For lung cancer screening, adhere to Lung-RADS guidelines. Reference: Radiology.  2017; 284(1):228-43.   [TY]    Clinical Course User Index [TY] Coral Spikes, DO                                 Medical Decision Making This is a 53 year old male presenting emergency department with viral URI type syndrome with chest pain, shortness of breath.  History consistent with GERD/indigestion and his description of burning type pain seemingly worsened with food.  His other symptoms seemingly viral in nature.  Denies cardiac history.  Clinically well-appearing.  Reassuring vital signs.  Maintaining saturation on room air.  Chest x-ray without pneumonia pneumothorax.  Workup reassuring as noted in ED course.  ACS unlikely.  Did have recent travel concern for possible pneumonia.  Overall low risk for PE.  D-dimer sent and pending.  Constellation of symptoms of congestion, rhinorrhea with some nausea, concern for possible viral etiology.  Test for flu/COVID.  However negative.  Treated with GI cocktail.  If D-dimer negative, will discharge with outpatient follow-up.  Patient D-dimer was in fact elevated, followed up with CTA.  No pulmonary embolism.  Feeling somewhat improved after GI cocktail.  Discussed for care and follow-up with primary doctor.  Stable for discharge at this time.  Amount and/or Complexity of Data Reviewed External Data Reviewed:     Details: history of alcohol abuse, but reportedly has not been drinking.  He is not withdrawing Labs: ordered. Decision-making details documented in ED Course.    Details: See above Radiology: ordered and independent interpretation performed. Decision-making details documented in ED Course.    Details: See ED course ECG/medicine tests:  Decision-making details documented in ED Course.  Risk OTC drugs. Prescription drug management. Decision regarding hospitalization.         Final Clinical Impression(s) / ED Diagnoses Final diagnoses:  Atypical chest pain    Rx / DC Orders ED Discharge Orders          Ordered     pantoprazole (PROTONIX) 20 MG tablet  Daily        11/13/23 0939    ondansetron (ZOFRAN-ODT) 4 MG disintegrating tablet  Every 8 hours PRN        11/13/23 1244              Coral Spikes, DO 11/13/23 1501

## 2023-11-13 NOTE — ED Notes (Signed)
 He has just returned from [place]. He remains in  no distress and has no requests at this time.

## 2023-11-13 NOTE — ED Triage Notes (Signed)
 Pt arrived POV, caox4, ambulatory c/o CP described as pressure and burning since Thursday with vomiting. Pt also c/o SOB. Pt states it initially felt like indigestion but has gotten worse. Denies fever or diarrhea. States he has had mild cough with some congestion. Denies cardiac PMH.

## 2023-11-13 NOTE — ED Notes (Signed)
 He c/o nausea; order for med received and med given.

## 2023-11-13 NOTE — ED Notes (Signed)
 He tells me his nausea "is a lot better".

## 2023-11-13 NOTE — Discharge Instructions (Signed)
 Please follow-up with your primary doctor.  Return immediately for fevers, chills, chest pain, worsening shortness of breath, no vomiting related nausea vomiting, passout or you develop any new or worsening symptoms that are concerning to you.

## 2023-11-17 DIAGNOSIS — K219 Gastro-esophageal reflux disease without esophagitis: Secondary | ICD-10-CM | POA: Diagnosis not present

## 2023-11-17 DIAGNOSIS — R109 Unspecified abdominal pain: Secondary | ICD-10-CM | POA: Diagnosis not present

## 2023-11-17 DIAGNOSIS — I10 Essential (primary) hypertension: Secondary | ICD-10-CM | POA: Diagnosis not present

## 2023-11-17 DIAGNOSIS — K2900 Acute gastritis without bleeding: Secondary | ICD-10-CM | POA: Diagnosis not present

## 2024-04-18 DIAGNOSIS — I10 Essential (primary) hypertension: Secondary | ICD-10-CM | POA: Diagnosis not present

## 2024-04-18 DIAGNOSIS — E1169 Type 2 diabetes mellitus with other specified complication: Secondary | ICD-10-CM | POA: Diagnosis not present
